# Patient Record
Sex: Male | Born: 1940
Health system: Southern US, Community
[De-identification: ages and names within clinical notes are randomized; demographics above are authoritative.]

## PROBLEM LIST (undated history)

## (undated) DIAGNOSIS — G20 Parkinson's disease: Secondary | ICD-10-CM

## (undated) DIAGNOSIS — C61 Malignant neoplasm of prostate: Secondary | ICD-10-CM

## (undated) DIAGNOSIS — I2109 ST elevation (STEMI) myocardial infarction involving other coronary artery of anterior wall: Secondary | ICD-10-CM

## (undated) DIAGNOSIS — G20A1 Parkinson's disease without dyskinesia, without mention of fluctuations: Secondary | ICD-10-CM

## (undated) DIAGNOSIS — E785 Hyperlipidemia, unspecified: Secondary | ICD-10-CM

## (undated) DIAGNOSIS — I2510 Atherosclerotic heart disease of native coronary artery without angina pectoris: Secondary | ICD-10-CM

## (undated) HISTORY — PX: LAPAROSCOPIC RETROPUBIC PROSTATECTOMY: SUR794

## (undated) HISTORY — DX: Hyperlipidemia, unspecified: E78.5

## (undated) HISTORY — DX: Atherosclerotic heart disease of native coronary artery without angina pectoris: I25.10

## (undated) HISTORY — PX: COLONOSCOPY: SHX174

## (undated) HISTORY — PX: HERNIA REPAIR: SHX51

## (undated) HISTORY — DX: ST elevation (STEMI) myocardial infarction involving other coronary artery of anterior wall: I21.09

## (undated) HISTORY — PX: CARDIAC CATHETERIZATION: SHX172

---

## 2007-04-27 ENCOUNTER — Ambulatory Visit: Payer: Self-pay | Admitting: Cardiology

## 2011-03-26 NOTE — Assessment & Plan Note (Signed)
[facility]                          [facility] OFFICE NOTE   Drew Sanders, Drew Sanders                        MRN:          [id number]  DATE:04/27/2007                            DOB:          [birth year]    REASON FOR CONSULTATION:  Drew Sanders is a 70 year old male, with  reportedly normal remote cardiac catheterization at [facility]  following an abnormal routine treadmill test, now referred for further  evaluation of recent development of chest pain.   The patient's cardiac risk factors are notable for borderline  hypertension, hyperlipidemia, family history of coronary artery disease  and age. The patient has no significant history of tobacco smoking and  denies diabetes mellitus.   The patient remains quite active and golf's several times a week. He  denies any history of exertional anginal pectoris, either remotely or in  the recent past. He also denies any recent decline in exercise  tolerance. Of note, the patient presents in sinus bradycardia and states  that he has always had a slow heart beat, with no associated symptoms.   The patient's current  complaint is recurrent, near daily upper chest  discomfort which appears to be quite focal. It is unpredictable in onset  and is not related to his typical heartburn. There is no radiation to  the jaw or upper extremities, nor any associated diaphoresis or dyspnea.  He also points out that, if anything, these symptoms appear to feel much  better with exercise.   The patient was recently placed on a proton pump inhibitor and an  antihistamine; however, he reports today that this has not had any  significant impact on his chest pains.   Electrocardiogram  today reveals sinus bradycardia at 52 BPM with normal  axis and no ischemic changes.   ALLERGIES:  No known drug allergies.   CURRENT MEDICATIONS:  1. Prilosec OTC 20 mg.  2. Zyrtec OTC 10 mg.  3. Glucosamine.  4. Saw palmetto.   PAST MEDICAL HISTORY:  1. History of hyperlipidemia.  2. Status post hiatal hernia repair.  3. GERD.   SOCIAL HISTORY:  Divorced, with 2 children. Retired Sports coach.  Denies any regular history of tobacco smoking. Drinks alcohol on  occasion.   FAMILY HISTORY:  Father deceased age 12, fatal myocardial infarction  with prior history of several myocardial infarctions.   REVIEW OF SYSTEMS:  Denies diabetes mellitus, has symptoms suggestive of  reflux disease. Otherwise as per HPI, remaining systems negative.   PHYSICAL EXAMINATION:  VITAL SIGNS:  Blood pressure currently 143/94,  pulse 58, regular, weight 218.6.  GENERAL:  A 70 year old male sitting upright in no distress.  HEENT:  Normocephalic, atraumatic.  NECK:  Palpable bilateral carotid pulses without bruits; no JVD.  LUNGS:  Clear to auscultation in all fields.  HEART:  Regular rate with decreased rhythm (S1, S2), no murmurs, rubs or  gallops.  ABDOMEN:  Soft, nontender with intact bowel sounds.  EXTREMITIES:  Palpable distal pulses without edema.  NEUROLOGIC:  No focal deficit.   IMPRESSION:  1. Atypical chest pain.      a.  Reportedly normal remote cardiac catheterization.  2. Asymptomatic sinus bradycardia.  3. History of hyperlipidemia.  4. Family history of coronary artery disease.  5. Gastroesophageal reflux disease.   PLAN:  1. Schedule exercise stress Cardiolite for risk stratification.  2. Check fasting lipid/liver profile.  3. I will start low dose aspirin for primary prevention.  4. Schedule return clinic followup with myself and Dr. Andee Sanders for      review of study results and further recommendations.      Drew Serpe, PA-C  Electronically Signed      Drew Codding, MD,FACC  Electronically Signed   GS/MedQ  DD: 04/27/2007  DT: 04/27/2007  Job #: [id number]   cc:   Drew Sanders

## 2015-01-09 DIAGNOSIS — H40053 Ocular hypertension, bilateral: Secondary | ICD-10-CM | POA: Diagnosis not present

## 2015-02-27 DIAGNOSIS — H52223 Regular astigmatism, bilateral: Secondary | ICD-10-CM | POA: Diagnosis not present

## 2015-02-27 DIAGNOSIS — H25813 Combined forms of age-related cataract, bilateral: Secondary | ICD-10-CM | POA: Diagnosis not present

## 2015-02-27 DIAGNOSIS — H35363 Drusen (degenerative) of macula, bilateral: Secondary | ICD-10-CM | POA: Diagnosis not present

## 2015-02-27 DIAGNOSIS — H35372 Puckering of macula, left eye: Secondary | ICD-10-CM | POA: Diagnosis not present

## 2015-02-27 DIAGNOSIS — H25812 Combined forms of age-related cataract, left eye: Secondary | ICD-10-CM | POA: Diagnosis not present

## 2015-04-03 ENCOUNTER — Encounter (HOSPITAL_COMMUNITY): Payer: Self-pay

## 2015-04-03 ENCOUNTER — Other Ambulatory Visit: Payer: Self-pay

## 2015-04-03 ENCOUNTER — Encounter (HOSPITAL_COMMUNITY)
Admission: RE | Admit: 2015-04-03 | Discharge: 2015-04-03 | Disposition: A | Discharge status: Home / Self Care | Payer: Medicare PPO | Source: Ambulatory Visit | Attending: Ophthalmology | Admitting: Ophthalmology

## 2015-04-03 DIAGNOSIS — H25812 Combined forms of age-related cataract, left eye: Secondary | ICD-10-CM | POA: Diagnosis not present

## 2015-04-03 LAB — BASIC METABOLIC PANEL
ANION GAP: 6 (ref 5–15)
BUN: 23 mg/dL — ABNORMAL HIGH (ref 6–20)
CHLORIDE: 106 mmol/L (ref 101–111)
CO2: 26 mmol/L (ref 22–32)
Calcium: 9 mg/dL (ref 8.9–10.3)
Creatinine, Ser: 1.65 mg/dL — ABNORMAL HIGH (ref 0.61–1.24)
GFR, EST AFRICAN AMERICAN: 46 mL/min — AB (ref >60)
GFR, EST NON AFRICAN AMERICAN: 39 mL/min — AB (ref >60)
Glucose, Bld: 113 mg/dL — ABNORMAL HIGH (ref 65–99)
Potassium: 4.5 mmol/L (ref 3.5–5.1)
Sodium: 138 mmol/L (ref 135–145)

## 2015-04-03 LAB — CBC
HCT: 43.5 % (ref 39.0–52.0)
Hemoglobin: 14.7 g/dL (ref 13.0–17.0)
MCH: 31.1 pg (ref 26.0–34.0)
MCHC: 33.8 g/dL (ref 30.0–36.0)
MCV: 92.2 fL (ref 78.0–100.0)
Platelets: 173 K/uL (ref 150–400)
RBC: 4.72 MIL/uL (ref 4.22–5.81)
RDW: 12.5 % (ref 11.5–15.5)
WBC: 8 K/uL (ref 4.0–10.5)

## 2015-04-03 NOTE — Patient Instructions (Signed)
Your procedure is scheduled on:  [id number]  Report to [facility] at   11:20  AM.  Call this number if you have problems the morning of surgery:[phone]   Remember:   Do not eat or drink :After Midnight.    Take these medicines the morning of surgery with A SIP OF WATER: none   Do not wear jewelry, make-up or nail polish.  Do not wear lotions, powders, or perfumes. You may wear deodorant.  Do not shave 48 hours prior to surgery.  Do not bring valuables to the hospital.  Contacts, dentures or bridgework may not be worn into surgery.  Patients discharged the day of surgery will not be allowed to drive home.  Name and phone number of your driver:    Please read over the following fact sheets that you were given: Pain Booklet, Surgical Site Infection Prevention, Anesthesia Post-op Instructions and Care and Recovery After Surgery  Cataract Surgery  A cataract is a clouding of the lens of the eye. When a lens becomes cloudy, vision is reduced based on the degree and nature of the clouding. Surgery may be needed to improve vision. Surgery removes the cloudy lens and usually replaces it with a substitute lens (intraocular lens, IOL). LET YOUR EYE DOCTOR KNOW ABOUT:  Allergies to food or medicine.   Medicines taken including herbs, eyedrops, over-the-counter medicines, and creams.   Use of steroids (by mouth or creams).   Previous problems with anesthetics or numbing medicine.   History of bleeding problems or blood clots.   Previous surgery.   Other health problems, including diabetes and kidney problems.   Possibility of pregnancy, if this applies.  RISKS AND COMPLICATIONS  Infection.   Inflammation of the eyeball (endophthalmitis) that can spread to both eyes (sympathetic ophthalmia).   Poor wound healing.   If an IOL is inserted, it can later fall out of proper position. This is very uncommon.   Clouding of the part of your eye that holds an IOL in place. This is  called an "after-cataract." These are uncommon, but easily treated.  BEFORE THE PROCEDURE  Do not eat or drink anything except small amounts of water for 8 to 12 before your surgery, or as directed by your caregiver.   Unless you are told otherwise, continue any eyedrops you have been prescribed.   Talk to your primary caregiver about all other medicines that you take (both prescription and non-prescription). In some cases, you may need to stop or change medicines near the time of your surgery. This is most important if you are taking blood-thinning medicine.Do not stop medicines unless you are told to do so.   Arrange for someone to drive you to and from the procedure.   Do not put contact lenses in either eye on the day of your surgery.  PROCEDURE There is more than one method for safely removing a cataract. Your doctor can explain the differences and help determine which is best for you. Phacoemulsification surgery is the most common form of cataract surgery.  An injection is given behind the eye or eyedrops are given to make this a painless procedure.   A small cut (incision) is made on the edge of the clear, dome-shaped surface that covers the front of the eye (cornea).   A tiny probe is painlessly inserted into the eye. This device gives off ultrasound waves that soften and break up the cloudy center of the lens. This makes it easier for the  cloudy lens to be removed by suction.   An IOL may be implanted.   The normal lens of the eye is covered by a clear capsule. Part of that capsule is intentionally left in the eye to support the IOL.   Your surgeon may or may not use stitches to close the incision.  There are other forms of cataract surgery that require a larger incision and stiches to close the eye. This approach is taken in cases where the doctor feels that the cataract cannot be easily removed using phacoemulsification. AFTER THE PROCEDURE  When an IOL is implanted, it  does not need care. It becomes a permanent part of your eye and cannot be seen or felt.   Your doctor will schedule follow-up exams to check on your progress.   Review your other medicines with your doctor to see which can be resumed after surgery.   Use eyedrops or take medicine as prescribed by your doctor.  Document Released: 10/17/2011 Document Reviewed: 10/14/2011 [facility] Patient Information 2012 [place].  .Cataract Surgery Care After Refer to this sheet in the next few weeks. These instructions provide you with information on caring for yourself after your procedure. Your caregiver may also give you more specific instructions. Your treatment has been planned according to current medical practices, but problems sometimes occur. Call your caregiver if you have any problems or questions after your procedure.  HOME CARE INSTRUCTIONS   Avoid strenuous activities as directed by your caregiver.   Ask your caregiver when you can resume driving.   Use eyedrops or other medicines to help healing and control pressure inside your eye as directed by your caregiver.   Only take over-the-counter or prescription medicines for pain, discomfort, or fever as directed by your caregiver.   Do not to touch or rub your eyes.   You may be instructed to use a protective shield during the first few days and nights after surgery. If not, wear sunglasses to protect your eyes. This is to protect the eye from pressure or from being accidentally bumped.   Keep the area around your eye clean and dry. Avoid swimming or allowing water to hit you directly in the face while showering. Keep soap and shampoo out of your eyes.   Do not bend or lift heavy objects. Bending increases pressure in the eye. You can walk, climb stairs, and do light household chores.   Do not put a contact lens into the eye that had surgery until your caregiver says it is okay to do so.   Ask your doctor when you can return to  work. This will depend on the kind of work that you do. If you work in a dusty environment, you may be advised to wear protective eyewear for a period of time.   Ask your caregiver when it will be safe to engage in sexual activity.   Continue with your regular eye exams as directed by your caregiver.  What to expect:  It is normal to feel itching and mild discomfort for a few days after cataract surgery. Some fluid discharge is also common, and your eye may be sensitive to light and touch.   After 1 to 2 days, even moderate discomfort should disappear. In most cases, healing will take about 6 weeks.   If you received an intraocular lens (IOL), you may notice that colors are very bright or have a blue tinge. Also, if you have been in bright sunlight, everything may appear reddish  for a few hours. If you see these color tinges, it is because your lens is clear and no longer cloudy. Within a few months after receiving an IOL, these extra colors should go away. When you have healed, you will probably need new glasses.  SEEK MEDICAL CARE IF:   You have increased bruising around your eye.   You have discomfort not helped by medicine.  SEEK IMMEDIATE MEDICAL CARE IF:   You have a fever.   You have a worsening or sudden vision loss.   You have redness, swelling, or increasing pain in the eye.   You have a thick discharge from the eye that had surgery.  MAKE SURE YOU:  Understand these instructions.   Will watch your condition.   Will get help right away if you are not doing well or get worse.  Document Released: 05/17/2005 Document Revised: 10/17/2011 Document Reviewed: 06/21/2011 [facility] Patient Information 2012 [place].    Monitored Anesthesia Care  Monitored anesthesia care is an anesthesia service for a medical procedure. Anesthesia is the loss of the ability to feel pain. It is produced by medications called anesthetics. It may affect a small area of your body (local  anesthesia), a large area of your body (regional anesthesia), or your entire body (general anesthesia). The need for monitored anesthesia care depends your procedure, your condition, and the potential need for regional or general anesthesia. It is often provided during procedures where:   General anesthesia may be needed if there are complications. This is because you need special care when you are under general anesthesia.   You will be under local or regional anesthesia. This is so that you are able to have higher levels of anesthesia if needed.   You will receive calming medications (sedatives). This is especially the case if sedatives are given to put you in a semi-conscious state of relaxation (deep sedation). This is because the amount of sedative needed to produce this state can be hard to predict. Too much of a sedative can produce general anesthesia. Monitored anesthesia care is performed by one or more caregivers who have special training in all types of anesthesia. You will need to meet with these caregivers before your procedure. During this meeting, they will ask you about your medical history. They will also give you instructions to follow. (For example, you will need to stop eating and drinking before your procedure. You may also need to stop or change medications you are taking.) During your procedure, your caregivers will stay with you. They will:   Watch your condition. This includes watching you blood pressure, breathing, and level of pain.   Diagnose and treat problems that occur.   Give medications if they are needed. These may include calming medications (sedatives) and anesthetics.   Make sure you are comfortable.  Having monitored anesthesia care does not necessarily mean that you will be under anesthesia. It does mean that your caregivers will be able to manage anesthesia if you need it or if it occurs. It also means that you will be able to have a different type of  anesthesia than you are having if you need it. When your procedure is complete, your caregivers will continue to watch your condition. They will make sure any medications wear off before you are allowed to go home.  Document Released: 07/24/2005 Document Revised: 02/22/2013 Document Reviewed: 12/09/2012 [facility] Patient Information 2014 [place], [place].

## 2015-04-05 MED ORDER — PHENYLEPHRINE HCL 2.5 % OP SOLN
OPHTHALMIC | Status: AC
  Filled 2015-04-05: qty 15

## 2015-04-05 MED ORDER — NEOMYCIN-POLYMYXIN-DEXAMETH 3.5-10000-0.1 OP SUSP
OPHTHALMIC | Status: AC
  Filled 2015-04-05: qty 5

## 2015-04-05 MED ORDER — TETRACAINE HCL 0.5 % OP SOLN
OPHTHALMIC | Status: AC
  Filled 2015-04-05: qty 2

## 2015-04-05 MED ORDER — LIDOCAINE HCL 3.5 % OP GEL
OPHTHALMIC | Status: AC
  Filled 2015-04-05: qty 1

## 2015-04-05 MED ORDER — LIDOCAINE HCL (PF) 1 % IJ SOLN
INTRAMUSCULAR | Status: AC
  Filled 2015-04-05: qty 2

## 2015-04-05 MED ORDER — CYCLOPENTOLATE-PHENYLEPHRINE OP SOLN OPTIME - NO CHARGE
OPHTHALMIC | Status: AC
  Filled 2015-04-05: qty 2

## 2015-04-06 ENCOUNTER — Ambulatory Visit (HOSPITAL_COMMUNITY): Payer: Medicare PPO | Admitting: Anesthesiology

## 2015-04-06 ENCOUNTER — Encounter (HOSPITAL_COMMUNITY)
Admission: RE | Disposition: A | Discharge status: Home / Self Care | Payer: Self-pay | Source: Ambulatory Visit | Attending: Ophthalmology

## 2015-04-06 ENCOUNTER — Ambulatory Visit (HOSPITAL_COMMUNITY)
Admission: RE | Admit: 2015-04-06 | Discharge: 2015-04-06 | Disposition: A | Discharge status: Home / Self Care | Payer: Medicare PPO | Source: Ambulatory Visit | Attending: Ophthalmology | Admitting: Ophthalmology

## 2015-04-06 ENCOUNTER — Encounter (HOSPITAL_COMMUNITY): Payer: Self-pay | Admitting: *Deleted

## 2015-04-06 DIAGNOSIS — H2512 Age-related nuclear cataract, left eye: Secondary | ICD-10-CM | POA: Diagnosis not present

## 2015-04-06 DIAGNOSIS — H259 Unspecified age-related cataract: Secondary | ICD-10-CM | POA: Diagnosis not present

## 2015-04-06 DIAGNOSIS — R69 Illness, unspecified: Secondary | ICD-10-CM | POA: Diagnosis not present

## 2015-04-06 DIAGNOSIS — H25812 Combined forms of age-related cataract, left eye: Secondary | ICD-10-CM | POA: Insufficient documentation

## 2015-04-06 HISTORY — PX: CATARACT EXTRACTION W/PHACO: SHX586

## 2015-04-06 SURGERY — PHACOEMULSIFICATION, CATARACT, WITH IOL INSERTION
Anesthesia: Monitor Anesthesia Care | Site: Eye | Laterality: Left

## 2015-04-06 MED ORDER — EPINEPHRINE HCL 1 MG/ML IJ SOLN
INTRAOCULAR | Status: DC | PRN
  Administered 2015-04-06: 500 mL

## 2015-04-06 MED ORDER — MIDAZOLAM HCL 2 MG/2ML IJ SOLN
INTRAMUSCULAR | Status: AC
  Filled 2015-04-06: qty 2

## 2015-04-06 MED ORDER — PROVISC 10 MG/ML IO SOLN
INTRAOCULAR | Status: DC | PRN
  Administered 2015-04-06: 0.85 mL via INTRAOCULAR

## 2015-04-06 MED ORDER — MIDAZOLAM HCL 2 MG/2ML IJ SOLN
1.0000 mg | INTRAMUSCULAR | Status: DC | PRN
  Administered 2015-04-06 (×2): 1 mg via INTRAVENOUS
  Administered 2015-04-06: 2 mg via INTRAVENOUS

## 2015-04-06 MED ORDER — LACTATED RINGERS IV SOLN
INTRAVENOUS | Status: DC
  Administered 2015-04-06: 13:00:00 via INTRAVENOUS

## 2015-04-06 MED ORDER — FENTANYL CITRATE (PF) 100 MCG/2ML IJ SOLN
INTRAMUSCULAR | Status: AC
  Filled 2015-04-06: qty 2

## 2015-04-06 MED ORDER — FENTANYL CITRATE (PF) 100 MCG/2ML IJ SOLN
25.0000 ug | INTRAMUSCULAR | Status: AC
  Administered 2015-04-06 (×2): 25 ug via INTRAVENOUS

## 2015-04-06 MED ORDER — LIDOCAINE HCL (PF) 1 % IJ SOLN
INTRAOCULAR | Status: DC | PRN
  Administered 2015-04-06: .9 mL via OPHTHALMIC

## 2015-04-06 MED ORDER — EPINEPHRINE HCL 1 MG/ML IJ SOLN
INTRAMUSCULAR | Status: AC
  Filled 2015-04-06: qty 1

## 2015-04-06 MED ORDER — LIDOCAINE 3.5 % OP GEL OPTIME - NO CHARGE
OPHTHALMIC | Status: DC | PRN
  Administered 2015-04-06: 1 [drp] via OPHTHALMIC

## 2015-04-06 MED ORDER — NEOMYCIN-POLYMYXIN-DEXAMETH 3.5-10000-0.1 OP SUSP
OPHTHALMIC | Status: DC | PRN
  Administered 2015-04-06: 1 [drp] via OPHTHALMIC

## 2015-04-06 MED ORDER — CYCLOPENTOLATE-PHENYLEPHRINE 0.2-1 % OP SOLN
1.0000 [drp] | OPHTHALMIC | Status: AC
  Administered 2015-04-06 (×3): 1 [drp] via OPHTHALMIC

## 2015-04-06 MED ORDER — BSS IO SOLN
INTRAOCULAR | Status: DC | PRN
  Administered 2015-04-06: 15 mL via INTRAOCULAR

## 2015-04-06 MED ORDER — PHENYLEPHRINE HCL 2.5 % OP SOLN
1.0000 [drp] | OPHTHALMIC | Status: AC
  Administered 2015-04-06 (×3): 1 [drp] via OPHTHALMIC

## 2015-04-06 MED ORDER — POVIDONE-IODINE 5 % OP SOLN
OPHTHALMIC | Status: DC | PRN
  Administered 2015-04-06: 1 via OPHTHALMIC

## 2015-04-06 MED ORDER — TETRACAINE HCL 0.5 % OP SOLN
1.0000 [drp] | OPHTHALMIC | Status: AC
  Administered 2015-04-06 (×3): 1 [drp] via OPHTHALMIC

## 2015-04-06 MED ORDER — LIDOCAINE HCL 3.5 % OP GEL: 1.0000 "application " | Freq: Once | OPHTHALMIC | Status: DC

## 2015-04-06 SURGICAL SUPPLY — 34 items
CAPSULAR TENSION RING-AMO (OPHTHALMIC RELATED) IMPLANT
CLOTH BEACON ORANGE TIMEOUT ST (SAFETY) ×2 IMPLANT
EYE SHIELD UNIVERSAL CLEAR (GAUZE/BANDAGES/DRESSINGS) ×2 IMPLANT
GLOVE BIO SURGEON STRL SZ 6.5 (GLOVE) IMPLANT
GLOVE BIO SURGEONS STRL SZ 6.5 (GLOVE)
GLOVE BIOGEL PI IND STRL 6.5 (GLOVE) IMPLANT
GLOVE BIOGEL PI IND STRL 7.0 (GLOVE) IMPLANT
GLOVE BIOGEL PI IND STRL 7.5 (GLOVE) IMPLANT
GLOVE BIOGEL PI INDICATOR 6.5 (GLOVE) ×2
GLOVE BIOGEL PI INDICATOR 7.0 (GLOVE)
GLOVE BIOGEL PI INDICATOR 7.5 (GLOVE)
GLOVE ECLIPSE 6.5 STRL STRAW (GLOVE) IMPLANT
GLOVE ECLIPSE 7.0 STRL STRAW (GLOVE) IMPLANT
GLOVE ECLIPSE 7.5 STRL STRAW (GLOVE) IMPLANT
GLOVE EXAM NITRILE LRG STRL (GLOVE) IMPLANT
GLOVE EXAM NITRILE MD LF STRL (GLOVE) ×2 IMPLANT
GLOVE SKINSENSE NS SZ6.5 (GLOVE)
GLOVE SKINSENSE NS SZ7.0 (GLOVE)
GLOVE SKINSENSE STRL SZ6.5 (GLOVE) IMPLANT
GLOVE SKINSENSE STRL SZ7.0 (GLOVE) IMPLANT
KIT VITRECTOMY (OPHTHALMIC RELATED) IMPLANT
PAD ARMBOARD 7.5X6 YLW CONV (MISCELLANEOUS) ×2 IMPLANT
PROC W NO LENS (INTRAOCULAR LENS)
PROC W SPEC LENS (INTRAOCULAR LENS)
PROCESS W NO LENS (INTRAOCULAR LENS) IMPLANT
PROCESS W SPEC LENS (INTRAOCULAR LENS) IMPLANT
RETRACTOR IRIS SIGHTPATH (OPHTHALMIC RELATED) IMPLANT
RING MALYGIN (MISCELLANEOUS) IMPLANT
SIGHTPATH CAT PROC W REG LENS (Ophthalmic Related) ×3 IMPLANT
SYRINGE LUER LOK 1CC (MISCELLANEOUS) ×2 IMPLANT
TAPE SURG TRANSPORE 1 IN (GAUZE/BANDAGES/DRESSINGS) IMPLANT
TAPE SURGICAL TRANSPORE 1 IN (GAUZE/BANDAGES/DRESSINGS) ×2
VISCOELASTIC ADDITIONAL (OPHTHALMIC RELATED) IMPLANT
WATER STERILE IRR 250ML POUR (IV SOLUTION) ×2 IMPLANT

## 2015-04-06 NOTE — Discharge Instructions (Signed)

## 2015-04-06 NOTE — Op Note (Signed)
Date of Admission: 04/06/2015  Date of Surgery: 04/06/2015   Pre-Op Dx: Cataract Left Eye  Post-Op Dx: Senile Combined Cataract Left  Eye,  Dx Code W09.811  Surgeon: Tonny Branch, M.D.  Assistants: None  Anesthesia: Topical with MAC  Indications: Painless, progressive loss of vision with compromise of daily activities.  Surgery: Cataract Extraction with Intraocular lens Implant Left Eye  Discription: The patient had dilating drops and viscous lidocaine placed into the Left eye in the pre-op holding area. After transfer to the operating room, a time out was performed. The patient was then prepped and draped. Beginning with a 44 degree blade a paracentesis port was made at the surgeon's 2 o'clock position. The anterior chamber was then filled with 1% non-preserved lidocaine. This was followed by filling the anterior chamber with Provisc.  A 2.29mm keratome blade was used to make a clear corneal incision at the temporal limbus.  A bent cystatome needle was used to create a continuous tear capsulotomy. Hydrodissection was performed with balanced salt solution on a Fine canula. The lens nucleus was then removed using the phacoemulsification handpiece. Residual cortex was removed with the I&A handpiece. The anterior chamber and capsular bag were refilled with Provisc. A posterior chamber intraocular lens was placed into the capsular bag with it's injector. The implant was positioned with the Kuglan hook. The Provisc was then removed from the anterior chamber and capsular bag with the I&A handpiece. Stromal hydration of the main incision and paracentesis port was performed with BSS on a Fine canula. The wounds were tested for leak which was negative. The patient tolerated the procedure well. There were no operative complications. The patient was then transferred to the recovery room in stable condition.  Complications: None  Specimen: None  EBL: None  Prosthetic device: Hoya iSert 250, power 13.0 D, SN  [id number].

## 2015-04-06 NOTE — Transfer of Care (Signed)
Immediate Anesthesia Transfer of Care Note  Patient: Drew Sanders  Procedure(s) Performed: Procedure(s): CATARACT EXTRACTION PHACO AND INTRAOCULAR LENS PLACEMENT :  CDE:  9.79 (Left)  Patient Location: Short Stay  Anesthesia Type:MAC  Level of Consciousness: awake, alert , oriented and patient cooperative  Airway & Oxygen Therapy: Patient Spontanous Breathing  Post-op Assessment: Report given to RN, Post -op Vital signs reviewed and stable and Patient moving all extremities X 4  Post vital signs: Reviewed and stable  Last Vitals:  Filed Vitals:   04/06/15 1225  BP: 177/86  Pulse: 57  Temp: 36.3 C  Resp: 14    Complications: No apparent anesthesia complications

## 2015-04-06 NOTE — Anesthesia Procedure Notes (Signed)
Procedure Name: MAC Date/Time: 04/06/2015 1:25 PM Performed by: Michele Rockers Pre-anesthesia Checklist: Patient identified, Emergency Drugs available, Suction available, Timeout performed and Patient being monitored Patient Re-evaluated:Patient Re-evaluated prior to inductionOxygen Delivery Method: Nasal Cannula

## 2015-04-06 NOTE — Anesthesia Postprocedure Evaluation (Signed)
  Anesthesia Post-op Note  Patient: Drew Sanders  Procedure(s) Performed: Procedure(s): CATARACT EXTRACTION PHACO AND INTRAOCULAR LENS PLACEMENT :  CDE:  9.79 (Left)  Patient Location: PACU and Short Stay  Anesthesia Type:MAC  Level of Consciousness: awake, alert , oriented and patient cooperative  Airway and Oxygen Therapy: Patient Spontanous Breathing  Post-op Pain: none  Post-op Assessment: Post-op Vital signs reviewed, Patient's Cardiovascular Status Stable, Respiratory Function Stable, Patent Airway, No signs of Nausea or vomiting, Adequate PO intake and Pain level controlled  Post-op Vital Signs: Reviewed and stable  Last Vitals:  Filed Vitals:   04/06/15 1225  BP: 177/86  Pulse: 57  Temp: 36.3 C  Resp: 14    Complications: No apparent anesthesia complications

## 2015-04-06 NOTE — H&P (Signed)
I have reviewed the H&P, the patient was re-examined, and I have identified no interval changes in medical condition and plan of care since the history and physical of record  

## 2015-04-06 NOTE — Anesthesia Preprocedure Evaluation (Signed)
Anesthesia Evaluation  Patient identified by MRN, date of birth, ID band Patient awake    Reviewed: Allergy & Precautions, NPO status , Patient's Chart, lab work & pertinent test results  Airway Mallampati: II  TM Distance: >3 FB     Dental  (+) Teeth Intact, Implants   Pulmonary neg pulmonary ROS,  breath sounds clear to auscultation        Cardiovascular negative cardio ROS  Rhythm:Regular Rate:Normal     Neuro/Psych    GI/Hepatic   Endo/Other    Renal/GU      Musculoskeletal   Abdominal   Peds  Hematology   Anesthesia Other Findings S/p prostate CA  Reproductive/Obstetrics                             Anesthesia Physical Anesthesia Plan  ASA: II  Anesthesia Plan: MAC   Post-op Pain Management:    Induction: Intravenous  Airway Management Planned: Nasal Cannula  Additional Equipment:   Intra-op Plan:   Post-operative Plan:   Informed Consent: I have reviewed the patients History and Physical, chart, labs and discussed the procedure including the risks, benefits and alternatives for the proposed anesthesia with the patient or authorized representative who has indicated his/her understanding and acceptance.     Plan Discussed with:   Anesthesia Plan Comments:         Anesthesia Quick Evaluation

## 2015-04-07 ENCOUNTER — Encounter (HOSPITAL_COMMUNITY): Payer: Self-pay | Admitting: Ophthalmology

## 2015-04-07 NOTE — Addendum Note (Signed)
Addendum  created 04/07/15 1220 by Mickel Baas, CRNA   Modules edited: Charges VN

## 2015-04-18 ENCOUNTER — Encounter (HOSPITAL_COMMUNITY): Payer: Self-pay

## 2015-04-18 ENCOUNTER — Encounter (HOSPITAL_COMMUNITY)
Admission: RE | Admit: 2015-04-18 | Discharge: 2015-04-18 | Disposition: A | Discharge status: Home / Self Care | Payer: Medicare PPO | Source: Ambulatory Visit | Attending: Ophthalmology | Admitting: Ophthalmology

## 2015-04-18 HISTORY — DX: Malignant neoplasm of prostate: C61

## 2015-04-21 MED ORDER — LIDOCAINE HCL 3.5 % OP GEL
OPHTHALMIC | Status: AC
  Filled 2015-04-21: qty 1

## 2015-04-21 MED ORDER — TETRACAINE HCL 0.5 % OP SOLN
OPHTHALMIC | Status: AC
  Filled 2015-04-21: qty 2

## 2015-04-21 MED ORDER — PHENYLEPHRINE HCL 2.5 % OP SOLN
OPHTHALMIC | Status: AC
  Filled 2015-04-21: qty 15

## 2015-04-21 MED ORDER — NEOMYCIN-POLYMYXIN-DEXAMETH 3.5-10000-0.1 OP SUSP
OPHTHALMIC | Status: AC
  Filled 2015-04-21: qty 5

## 2015-04-21 MED ORDER — LIDOCAINE HCL (PF) 1 % IJ SOLN
INTRAMUSCULAR | Status: AC
  Filled 2015-04-21: qty 2

## 2015-04-21 MED ORDER — CYCLOPENTOLATE-PHENYLEPHRINE OP SOLN OPTIME - NO CHARGE
OPHTHALMIC | Status: AC
  Filled 2015-04-21: qty 2

## 2015-04-24 ENCOUNTER — Ambulatory Visit (HOSPITAL_COMMUNITY): Payer: Medicare PPO | Admitting: Anesthesiology

## 2015-04-24 ENCOUNTER — Encounter (HOSPITAL_COMMUNITY)
Admission: RE | Disposition: A | Discharge status: Home / Self Care | Payer: Self-pay | Source: Ambulatory Visit | Attending: Ophthalmology

## 2015-04-24 ENCOUNTER — Encounter (HOSPITAL_COMMUNITY): Payer: Self-pay | Admitting: *Deleted

## 2015-04-24 ENCOUNTER — Ambulatory Visit (HOSPITAL_COMMUNITY)
Admission: RE | Admit: 2015-04-24 | Discharge: 2015-04-24 | Disposition: A | Discharge status: Home / Self Care | Payer: Medicare PPO | Source: Ambulatory Visit | Attending: Ophthalmology | Admitting: Ophthalmology

## 2015-04-24 DIAGNOSIS — H25811 Combined forms of age-related cataract, right eye: Secondary | ICD-10-CM | POA: Diagnosis not present

## 2015-04-24 DIAGNOSIS — Z8546 Personal history of malignant neoplasm of prostate: Secondary | ICD-10-CM | POA: Diagnosis not present

## 2015-04-24 DIAGNOSIS — H269 Unspecified cataract: Secondary | ICD-10-CM | POA: Diagnosis present

## 2015-04-24 HISTORY — PX: CATARACT EXTRACTION W/PHACO: SHX586

## 2015-04-24 SURGERY — PHACOEMULSIFICATION, CATARACT, WITH IOL INSERTION
Anesthesia: Monitor Anesthesia Care | Site: Eye | Laterality: Right

## 2015-04-24 MED ORDER — BSS IO SOLN
INTRAOCULAR | Status: DC | PRN
  Administered 2015-04-24: 500 mL

## 2015-04-24 MED ORDER — EPINEPHRINE HCL 1 MG/ML IJ SOLN
INTRAMUSCULAR | Status: AC
  Filled 2015-04-24: qty 1

## 2015-04-24 MED ORDER — CYCLOPENTOLATE-PHENYLEPHRINE 0.2-1 % OP SOLN
1.0000 [drp] | OPHTHALMIC | Status: AC
  Administered 2015-04-24 (×3): 1 [drp] via OPHTHALMIC

## 2015-04-24 MED ORDER — NEOMYCIN-POLYMYXIN-DEXAMETH 3.5-10000-0.1 OP SUSP
OPHTHALMIC | Status: DC | PRN
  Administered 2015-04-24: 2 [drp] via OPHTHALMIC

## 2015-04-24 MED ORDER — FENTANYL CITRATE (PF) 100 MCG/2ML IJ SOLN
25.0000 ug | INTRAMUSCULAR | Status: AC
  Administered 2015-04-24 (×2): 25 ug via INTRAVENOUS

## 2015-04-24 MED ORDER — MIDAZOLAM HCL 2 MG/2ML IJ SOLN
INTRAMUSCULAR | Status: AC
  Filled 2015-04-24: qty 2

## 2015-04-24 MED ORDER — LIDOCAINE HCL (PF) 1 % IJ SOLN
INTRAOCULAR | Status: DC | PRN
  Administered 2015-04-24: .7 mL via OPHTHALMIC

## 2015-04-24 MED ORDER — MIDAZOLAM HCL 2 MG/2ML IJ SOLN
1.0000 mg | INTRAMUSCULAR | Status: DC | PRN
Start: 2015-04-24 — End: 2015-04-24
  Administered 2015-04-24: 2 mg via INTRAVENOUS

## 2015-04-24 MED ORDER — BSS IO SOLN
INTRAOCULAR | Status: AC
  Filled 2015-04-24: qty 4

## 2015-04-24 MED ORDER — FENTANYL CITRATE (PF) 100 MCG/2ML IJ SOLN
INTRAMUSCULAR | Status: AC
  Filled 2015-04-24: qty 2

## 2015-04-24 MED ORDER — LIDOCAINE HCL 3.5 % OP GEL
1.0000 "application " | Freq: Once | OPHTHALMIC | Status: AC
  Administered 2015-04-24: 1 via OPHTHALMIC

## 2015-04-24 MED ORDER — LACTATED RINGERS IV SOLN
INTRAVENOUS | Status: DC
  Administered 2015-04-24: 1000 mL via INTRAVENOUS

## 2015-04-24 MED ORDER — BSS IO SOLN
INTRAOCULAR | Status: DC | PRN
  Administered 2015-04-24: 15 mL

## 2015-04-24 MED ORDER — PROVISC 10 MG/ML IO SOLN
INTRAOCULAR | Status: DC | PRN
  Administered 2015-04-24: 0.85 mL via INTRAOCULAR

## 2015-04-24 MED ORDER — POVIDONE-IODINE 5 % OP SOLN
OPHTHALMIC | Status: DC | PRN
  Administered 2015-04-24: 1 via OPHTHALMIC

## 2015-04-24 MED ORDER — TETRACAINE HCL 0.5 % OP SOLN
1.0000 [drp] | OPHTHALMIC | Status: AC
  Administered 2015-04-24 (×3): 1 [drp] via OPHTHALMIC

## 2015-04-24 MED ORDER — PHENYLEPHRINE HCL 2.5 % OP SOLN
1.0000 [drp] | OPHTHALMIC | Status: AC
  Administered 2015-04-24 (×3): 1 [drp] via OPHTHALMIC

## 2015-04-24 SURGICAL SUPPLY — 10 items
CLOTH BEACON ORANGE TIMEOUT ST (SAFETY) ×2 IMPLANT
EYE SHIELD UNIVERSAL CLEAR (GAUZE/BANDAGES/DRESSINGS) ×2 IMPLANT
GLOVE BIOGEL PI IND STRL 7.0 (GLOVE) IMPLANT
GLOVE BIOGEL PI INDICATOR 7.0 (GLOVE) ×4
PAD ARMBOARD 7.5X6 YLW CONV (MISCELLANEOUS) ×2 IMPLANT
SIGHTPATH CAT PROC W REG LENS (Ophthalmic Related) ×3 IMPLANT
SYRINGE LUER LOK 1CC (MISCELLANEOUS) ×2 IMPLANT
TAPE SURG TRANSPORE 1 IN (GAUZE/BANDAGES/DRESSINGS) IMPLANT
TAPE SURGICAL TRANSPORE 1 IN (GAUZE/BANDAGES/DRESSINGS) ×2
WATER STERILE IRR 250ML POUR (IV SOLUTION) ×2 IMPLANT

## 2015-04-24 NOTE — Transfer of Care (Signed)
Immediate Anesthesia Transfer of Care Note  Patient: Drew Sanders  Procedure(s) Performed: Procedure(s) with comments: CATARACT EXTRACTION PHACO AND INTRAOCULAR LENS PLACEMENT (IOC) (Right) - CDE: 12.67  Patient Location: Short Stay  Anesthesia Type:MAC  Level of Consciousness: awake, alert  and oriented  Airway & Oxygen Therapy: Patient Spontanous Breathing  Post-op Assessment: Report given to RN  Post vital signs: Reviewed  Last Vitals:  Filed Vitals:   04/24/15 1350  BP: 115/74  Pulse:   Temp:   Resp: 11    Complications: No apparent anesthesia complications

## 2015-04-24 NOTE — Anesthesia Postprocedure Evaluation (Signed)
  Anesthesia Post-op Note  Patient: Drew Sanders  Procedure(s) Performed: Procedure(s) with comments: CATARACT EXTRACTION PHACO AND INTRAOCULAR LENS PLACEMENT (IOC) (Right) - CDE: 12.67  Patient Location: Short Stay  Anesthesia Type:MAC  Level of Consciousness: awake, alert  and oriented  Airway and Oxygen Therapy: Patient Spontanous Breathing  Post-op Pain: none  Post-op Assessment: Post-op Vital signs reviewed, Patient's Cardiovascular Status Stable, Respiratory Function Stable, Patent Airway and No signs of Nausea or vomiting              Post-op Vital Signs: Reviewed and stable  Last Vitals:  Filed Vitals:   04/24/15 1350  BP: 115/74  Pulse:   Temp:   Resp: 11    Complications: No apparent anesthesia complications

## 2015-04-24 NOTE — H&P (Signed)
I have reviewed the H&P, the patient was re-examined, and I have identified no interval changes in medical condition and plan of care since the history and physical of record  

## 2015-04-24 NOTE — Op Note (Addendum)
Date of Admission: 04/24/2015  Date of Surgery: 04/24/2015   Pre-Op Dx: Cataract Right Eye  Post-Op Dx: Senile Combined Cataract Right  Eye,  Dx Code Y33.383  Surgeon: Tonny Branch, M.D.  Assistants: None  Anesthesia: Topical with MAC  Indications: Painless, progressive loss of vision with compromise of daily activities.  Surgery: Cataract Extraction with Intraocular lens Implant Right Eye  Discription: The patient had dilating drops and viscous lidocaine placed into the Right eye in the pre-op holding area. After transfer to the operating room, a time out was performed. The patient was then prepped and draped. Omidria was mixed into the infusion bottle. Beginning with a 70 degree blade a paracentesis port was made at the surgeon's 2 o'clock position. The anterior chamber was then filled with 1% non-preserved lidocaine with epi. This was followed by filling the anterior chamber with Provisc.  A 2.16mm keratome blade was used to make a clear corneal incision at the temporal limbus.  A bent cystatome needle was used to create a continuous tear capsulotomy. Hydrodissection was performed with balanced salt solution on a Fine canula. The lens nucleus was then removed using the phacoemulsification handpiece. Residual cortex was removed with the I&A handpiece. The anterior chamber and capsular bag were refilled with Provisc. A posterior chamber intraocular lens was placed into the capsular bag with it's injector. The implant was positioned with the Kuglan hook. The Provisc was then removed from the anterior chamber and capsular bag with the I&A handpiece. Stromal hydration of the main incision and paracentesis port was performed with BSS on a Fine canula. The wounds were tested for leak which was negative. The patient tolerated the procedure well. There were no operative complications. The patient was then transferred to the recovery room in stable condition.  Complications: None  Specimen: None  EBL:  None  Prosthetic device: Hoya iSert 250, power 13.0 D, SN [id number].

## 2015-04-24 NOTE — Anesthesia Preprocedure Evaluation (Signed)
Anesthesia Evaluation  Patient identified by MRN, date of birth, ID band Patient awake    Reviewed: Allergy & Precautions, NPO status , Patient's Chart, lab work & pertinent test results  Airway Mallampati: II  TM Distance: >3 FB     Dental  (+) Teeth Intact, Implants   Pulmonary neg pulmonary ROS,  breath sounds clear to auscultation        Cardiovascular negative cardio ROS  Rhythm:Regular Rate:Normal     Neuro/Psych    GI/Hepatic   Endo/Other    Renal/GU      Musculoskeletal   Abdominal   Peds  Hematology   Anesthesia Other Findings S/p prostate CA  Reproductive/Obstetrics                             Anesthesia Physical Anesthesia Plan  ASA: II  Anesthesia Plan: MAC   Post-op Pain Management:    Induction: Intravenous  Airway Management Planned: Nasal Cannula  Additional Equipment:   Intra-op Plan:   Post-operative Plan:   Informed Consent: I have reviewed the patients History and Physical, chart, labs and discussed the procedure including the risks, benefits and alternatives for the proposed anesthesia with the patient or authorized representative who has indicated his/her understanding and acceptance.     Plan Discussed with:   Anesthesia Plan Comments:         Anesthesia Quick Evaluation

## 2015-04-24 NOTE — Discharge Instructions (Signed)

## 2015-04-25 ENCOUNTER — Encounter (HOSPITAL_COMMUNITY): Payer: Self-pay | Admitting: Ophthalmology

## 2015-11-17 DIAGNOSIS — G250 Essential tremor: Secondary | ICD-10-CM | POA: Diagnosis not present

## 2015-11-17 DIAGNOSIS — Z6827 Body mass index (BMI) 27.0-27.9, adult: Secondary | ICD-10-CM | POA: Diagnosis not present

## 2015-11-17 DIAGNOSIS — E784 Other hyperlipidemia: Secondary | ICD-10-CM | POA: Diagnosis not present

## 2016-02-19 DIAGNOSIS — E784 Other hyperlipidemia: Secondary | ICD-10-CM | POA: Diagnosis not present

## 2016-02-19 DIAGNOSIS — Z6827 Body mass index (BMI) 27.0-27.9, adult: Secondary | ICD-10-CM | POA: Diagnosis not present

## 2016-02-19 DIAGNOSIS — I10 Essential (primary) hypertension: Secondary | ICD-10-CM | POA: Diagnosis not present

## 2016-02-19 DIAGNOSIS — M545 Low back pain: Secondary | ICD-10-CM | POA: Diagnosis not present

## 2016-06-18 DIAGNOSIS — H16221 Keratoconjunctivitis sicca, not specified as Sjogren's, right eye: Secondary | ICD-10-CM | POA: Diagnosis not present

## 2016-06-18 DIAGNOSIS — G250 Essential tremor: Secondary | ICD-10-CM | POA: Diagnosis not present

## 2016-06-18 DIAGNOSIS — Z6826 Body mass index (BMI) 26.0-26.9, adult: Secondary | ICD-10-CM | POA: Diagnosis not present

## 2016-06-18 DIAGNOSIS — M545 Low back pain: Secondary | ICD-10-CM | POA: Diagnosis not present

## 2016-06-18 DIAGNOSIS — E784 Other hyperlipidemia: Secondary | ICD-10-CM | POA: Diagnosis not present

## 2016-06-20 DIAGNOSIS — M47816 Spondylosis without myelopathy or radiculopathy, lumbar region: Secondary | ICD-10-CM | POA: Diagnosis not present

## 2016-06-20 DIAGNOSIS — M545 Low back pain: Secondary | ICD-10-CM | POA: Diagnosis not present

## 2016-08-19 DIAGNOSIS — M545 Low back pain: Secondary | ICD-10-CM | POA: Diagnosis not present

## 2016-08-19 DIAGNOSIS — Z6826 Body mass index (BMI) 26.0-26.9, adult: Secondary | ICD-10-CM | POA: Diagnosis not present

## 2016-08-19 DIAGNOSIS — G20 Parkinson's disease: Secondary | ICD-10-CM | POA: Diagnosis not present

## 2016-08-19 DIAGNOSIS — E784 Other hyperlipidemia: Secondary | ICD-10-CM | POA: Diagnosis not present

## 2016-11-25 DIAGNOSIS — Z6826 Body mass index (BMI) 26.0-26.9, adult: Secondary | ICD-10-CM | POA: Diagnosis not present

## 2016-11-25 DIAGNOSIS — G250 Essential tremor: Secondary | ICD-10-CM | POA: Diagnosis not present

## 2016-11-25 DIAGNOSIS — Z125 Encounter for screening for malignant neoplasm of prostate: Secondary | ICD-10-CM | POA: Diagnosis not present

## 2016-11-25 DIAGNOSIS — Z0000 Encounter for general adult medical examination without abnormal findings: Secondary | ICD-10-CM | POA: Diagnosis not present

## 2016-11-25 DIAGNOSIS — E784 Other hyperlipidemia: Secondary | ICD-10-CM | POA: Diagnosis not present

## 2016-11-25 DIAGNOSIS — Z79899 Other long term (current) drug therapy: Secondary | ICD-10-CM | POA: Diagnosis not present

## 2016-11-25 DIAGNOSIS — Z1389 Encounter for screening for other disorder: Secondary | ICD-10-CM | POA: Diagnosis not present

## 2016-11-25 DIAGNOSIS — M545 Low back pain: Secondary | ICD-10-CM | POA: Diagnosis not present

## 2017-02-24 DIAGNOSIS — M545 Low back pain: Secondary | ICD-10-CM | POA: Diagnosis not present

## 2017-02-24 DIAGNOSIS — G250 Essential tremor: Secondary | ICD-10-CM | POA: Diagnosis not present

## 2017-02-24 DIAGNOSIS — Z6826 Body mass index (BMI) 26.0-26.9, adult: Secondary | ICD-10-CM | POA: Diagnosis not present

## 2017-02-24 DIAGNOSIS — E784 Other hyperlipidemia: Secondary | ICD-10-CM | POA: Diagnosis not present

## 2017-03-14 ENCOUNTER — Inpatient Hospital Stay (HOSPITAL_COMMUNITY)
Admission: EM | Admit: 2017-03-14 | Discharge: 2017-03-17 | DRG: 246 | Disposition: A | Discharge status: Home / Self Care | Payer: Medicare PPO | Attending: Internal Medicine | Admitting: Internal Medicine

## 2017-03-14 ENCOUNTER — Encounter (HOSPITAL_COMMUNITY): Payer: Self-pay | Admitting: Physician Assistant

## 2017-03-14 ENCOUNTER — Inpatient Hospital Stay (HOSPITAL_COMMUNITY)
Admission: EM | Disposition: A | Discharge status: Home / Self Care | Payer: Self-pay | Source: Home / Self Care | Attending: Internal Medicine

## 2017-03-14 DIAGNOSIS — I472 Ventricular tachycardia: Secondary | ICD-10-CM | POA: Diagnosis not present

## 2017-03-14 DIAGNOSIS — I2119 ST elevation (STEMI) myocardial infarction involving other coronary artery of inferior wall: Secondary | ICD-10-CM | POA: Diagnosis not present

## 2017-03-14 DIAGNOSIS — I255 Ischemic cardiomyopathy: Secondary | ICD-10-CM | POA: Diagnosis present

## 2017-03-14 DIAGNOSIS — I4729 Other ventricular tachycardia: Secondary | ICD-10-CM

## 2017-03-14 DIAGNOSIS — Z8546 Personal history of malignant neoplasm of prostate: Secondary | ICD-10-CM | POA: Diagnosis not present

## 2017-03-14 DIAGNOSIS — I2102 ST elevation (STEMI) myocardial infarction involving left anterior descending coronary artery: Secondary | ICD-10-CM | POA: Diagnosis not present

## 2017-03-14 DIAGNOSIS — N189 Chronic kidney disease, unspecified: Secondary | ICD-10-CM | POA: Diagnosis not present

## 2017-03-14 DIAGNOSIS — G20 Parkinson's disease: Secondary | ICD-10-CM | POA: Diagnosis present

## 2017-03-14 DIAGNOSIS — I213 ST elevation (STEMI) myocardial infarction of unspecified site: Secondary | ICD-10-CM | POA: Diagnosis not present

## 2017-03-14 DIAGNOSIS — E785 Hyperlipidemia, unspecified: Secondary | ICD-10-CM | POA: Diagnosis not present

## 2017-03-14 DIAGNOSIS — I9589 Other hypotension: Secondary | ICD-10-CM | POA: Diagnosis not present

## 2017-03-14 DIAGNOSIS — I2510 Atherosclerotic heart disease of native coronary artery without angina pectoris: Secondary | ICD-10-CM | POA: Diagnosis not present

## 2017-03-14 DIAGNOSIS — I2109 ST elevation (STEMI) myocardial infarction involving other coronary artery of anterior wall: Secondary | ICD-10-CM | POA: Diagnosis not present

## 2017-03-14 DIAGNOSIS — Z955 Presence of coronary angioplasty implant and graft: Secondary | ICD-10-CM | POA: Diagnosis not present

## 2017-03-14 DIAGNOSIS — Z8249 Family history of ischemic heart disease and other diseases of the circulatory system: Secondary | ICD-10-CM | POA: Diagnosis not present

## 2017-03-14 DIAGNOSIS — Z79899 Other long term (current) drug therapy: Secondary | ICD-10-CM | POA: Diagnosis not present

## 2017-03-14 DIAGNOSIS — I5041 Acute combined systolic (congestive) and diastolic (congestive) heart failure: Secondary | ICD-10-CM | POA: Diagnosis not present

## 2017-03-14 DIAGNOSIS — N179 Acute kidney failure, unspecified: Secondary | ICD-10-CM | POA: Diagnosis not present

## 2017-03-14 DIAGNOSIS — R001 Bradycardia, unspecified: Secondary | ICD-10-CM | POA: Diagnosis not present

## 2017-03-14 DIAGNOSIS — R0789 Other chest pain: Secondary | ICD-10-CM | POA: Diagnosis not present

## 2017-03-14 HISTORY — PX: LEFT HEART CATH AND CORONARY ANGIOGRAPHY: CATH118249

## 2017-03-14 HISTORY — DX: Parkinson's disease without dyskinesia, without mention of fluctuations: G20.A1

## 2017-03-14 HISTORY — PX: INTRAVASCULAR ULTRASOUND/IVUS: CATH118244

## 2017-03-14 HISTORY — DX: Parkinson's disease: G20

## 2017-03-14 HISTORY — PX: CORONARY STENT INTERVENTION: CATH118234

## 2017-03-14 LAB — LIPID PANEL
CHOL/HDL RATIO: 4.3 ratio
CHOLESTEROL: 141 mg/dL (ref 0–200)
HDL: 33 mg/dL — AB (ref >40)
LDL Cholesterol: 99 mg/dL (ref 0–99)
Triglycerides: 46 mg/dL (ref <150)
VLDL: 9 mg/dL (ref 0–40)

## 2017-03-14 LAB — COMPREHENSIVE METABOLIC PANEL
ALBUMIN: 3.6 g/dL (ref 3.5–5.0)
ALT: 18 U/L (ref 17–63)
ANION GAP: 10 (ref 5–15)
AST: 74 U/L — AB (ref 15–41)
Alkaline Phosphatase: 64 U/L (ref 38–126)
BILIRUBIN TOTAL: 1.1 mg/dL (ref 0.3–1.2)
BUN: 26 mg/dL — AB (ref 6–20)
CHLORIDE: 104 mmol/L (ref 101–111)
CO2: 19 mmol/L — ABNORMAL LOW (ref 22–32)
Calcium: 8.3 mg/dL — ABNORMAL LOW (ref 8.9–10.3)
Creatinine, Ser: 1.86 mg/dL — ABNORMAL HIGH (ref 0.61–1.24)
GFR calc Af Amer: 39 mL/min — ABNORMAL LOW (ref >60)
GFR calc non Af Amer: 34 mL/min — ABNORMAL LOW (ref >60)
GLUCOSE: 121 mg/dL — AB (ref 65–99)
POTASSIUM: 3.9 mmol/L (ref 3.5–5.1)
Sodium: 133 mmol/L — ABNORMAL LOW (ref 135–145)
TOTAL PROTEIN: 6 g/dL — AB (ref 6.5–8.1)

## 2017-03-14 LAB — TROPONIN I: Troponin I: 6.01 ng/mL (ref <0.03)

## 2017-03-14 LAB — PROTIME-INR
INR: 1.28
PROTHROMBIN TIME: 16.1 s — AB (ref 11.4–15.2)

## 2017-03-14 LAB — POCT I-STAT, CHEM 8
BUN: 27 mg/dL — AB (ref 6–20)
CREATININE: 2 mg/dL — AB (ref 0.61–1.24)
Calcium, Ion: 1.22 mmol/L (ref 1.15–1.40)
Chloride: 104 mmol/L (ref 101–111)
GLUCOSE: 110 mg/dL — AB (ref 65–99)
HCT: 41 % (ref 39.0–52.0)
HEMOGLOBIN: 13.9 g/dL (ref 13.0–17.0)
Potassium: 3.9 mmol/L (ref 3.5–5.1)
Sodium: 138 mmol/L (ref 135–145)
TCO2: 22 mmol/L (ref 0–100)

## 2017-03-14 LAB — CBC
HCT: 39.7 % (ref 39.0–52.0)
Hemoglobin: 13.7 g/dL (ref 13.0–17.0)
MCH: 31.6 pg (ref 26.0–34.0)
MCHC: 34.5 g/dL (ref 30.0–36.0)
MCV: 91.5 fL (ref 78.0–100.0)
PLATELETS: 139 10*3/uL — AB (ref 150–400)
RBC: 4.34 MIL/uL (ref 4.22–5.81)
RDW: 12.9 % (ref 11.5–15.5)
WBC: 9.5 10*3/uL (ref 4.0–10.5)

## 2017-03-14 LAB — POCT ACTIVATED CLOTTING TIME
Activated Clotting Time: 346 seconds
Activated Clotting Time: 296 seconds

## 2017-03-14 LAB — APTT: aPTT: >200 seconds (ref 24–36)

## 2017-03-14 SURGERY — LEFT HEART CATH AND CORONARY ANGIOGRAPHY
Anesthesia: LOCAL

## 2017-03-14 MED ORDER — LIDOCAINE HCL (PF) 1 % IJ SOLN
INTRAMUSCULAR | Status: DC | PRN
  Administered 2017-03-14: 2 mL

## 2017-03-14 MED ORDER — HYDRALAZINE HCL 20 MG/ML IJ SOLN: 5.0000 mg | INTRAMUSCULAR | Status: AC | PRN

## 2017-03-14 MED ORDER — TICAGRELOR 90 MG PO TABS
ORAL_TABLET | ORAL | Status: DC | PRN
  Administered 2017-03-14: 180 mg via ORAL

## 2017-03-14 MED ORDER — METOPROLOL TARTRATE 12.5 MG HALF TABLET
12.5000 mg | ORAL_TABLET | Freq: Two times a day (BID) | ORAL | Status: DC
  Administered 2017-03-14 – 2017-03-15 (×2): 12.5 mg via ORAL
  Filled 2017-03-14 (×2): qty 1

## 2017-03-14 MED ORDER — DOPAMINE-DEXTROSE 3.2-5 MG/ML-% IV SOLN
INTRAVENOUS | Status: AC
  Filled 2017-03-14: qty 250

## 2017-03-14 MED ORDER — SODIUM CHLORIDE 0.9% FLUSH
3.0000 mL | Freq: Two times a day (BID) | INTRAVENOUS | Status: DC
  Administered 2017-03-14: 3 mL via INTRAVENOUS

## 2017-03-14 MED ORDER — TICAGRELOR 90 MG PO TABS
ORAL_TABLET | ORAL | Status: AC
  Filled 2017-03-14: qty 2

## 2017-03-14 MED ORDER — ADENOSINE 6 MG/2ML IV SOLN
INTRAVENOUS | Status: AC
  Filled 2017-03-14: qty 2

## 2017-03-14 MED ORDER — ONDANSETRON HCL 4 MG/2ML IJ SOLN: 4.0000 mg | Freq: Four times a day (QID) | INTRAMUSCULAR | Status: DC | PRN

## 2017-03-14 MED ORDER — HEPARIN SODIUM (PORCINE) 5000 UNIT/ML IJ SOLN
5000.0000 [IU] | Freq: Three times a day (TID) | INTRAMUSCULAR | Status: DC
  Administered 2017-03-15 – 2017-03-17 (×7): 5000 [IU] via SUBCUTANEOUS
  Filled 2017-03-14 (×7): qty 1

## 2017-03-14 MED ORDER — VERAPAMIL HCL 2.5 MG/ML IV SOLN
INTRAVENOUS | Status: DC | PRN
  Administered 2017-03-14: 10 mL via INTRA_ARTERIAL

## 2017-03-14 MED ORDER — ATORVASTATIN CALCIUM 80 MG PO TABS
80.0000 mg | ORAL_TABLET | Freq: Every day | ORAL | Status: DC
  Administered 2017-03-14 – 2017-03-17 (×4): 80 mg via ORAL
  Filled 2017-03-14 (×4): qty 1

## 2017-03-14 MED ORDER — NITROGLYCERIN 1 MG/10 ML FOR IR/CATH LAB
INTRA_ARTERIAL | Status: AC
  Filled 2017-03-14: qty 10

## 2017-03-14 MED ORDER — LIDOCAINE HCL 1 % IJ SOLN
INTRAMUSCULAR | Status: AC
  Filled 2017-03-14: qty 20

## 2017-03-14 MED ORDER — TICAGRELOR 90 MG PO TABS
90.0000 mg | ORAL_TABLET | Freq: Two times a day (BID) | ORAL | Status: DC
  Administered 2017-03-14 – 2017-03-17 (×6): 90 mg via ORAL
  Filled 2017-03-14 (×6): qty 1

## 2017-03-14 MED ORDER — VERAPAMIL HCL 2.5 MG/ML IV SOLN
INTRAVENOUS | Status: AC
  Filled 2017-03-14: qty 2

## 2017-03-14 MED ORDER — ASPIRIN 81 MG PO CHEW
81.0000 mg | CHEWABLE_TABLET | Freq: Every day | ORAL | Status: DC
  Administered 2017-03-15 – 2017-03-17 (×3): 81 mg via ORAL
  Filled 2017-03-14 (×3): qty 1

## 2017-03-14 MED ORDER — ONDANSETRON HCL 4 MG/2ML IJ SOLN
INTRAMUSCULAR | Status: DC | PRN
  Administered 2017-03-14: 4 mg via INTRAVENOUS

## 2017-03-14 MED ORDER — ACETAMINOPHEN 325 MG PO TABS
650.0000 mg | ORAL_TABLET | ORAL | Status: DC | PRN
  Administered 2017-03-14 – 2017-03-15 (×2): 650 mg via ORAL
  Filled 2017-03-14 (×2): qty 2

## 2017-03-14 MED ORDER — SODIUM CHLORIDE 0.9 % IV SOLN: INTRAVENOUS | Status: AC

## 2017-03-14 MED ORDER — HEPARIN (PORCINE) IN NACL 2-0.9 UNIT/ML-% IJ SOLN
INTRAMUSCULAR | Status: AC
  Filled 2017-03-14: qty 1000

## 2017-03-14 MED ORDER — IOPAMIDOL (ISOVUE-370) INJECTION 76%
INTRAVENOUS | Status: AC
  Filled 2017-03-14: qty 50

## 2017-03-14 MED ORDER — SODIUM CHLORIDE 0.9% FLUSH: 3.0000 mL | INTRAVENOUS | Status: DC | PRN

## 2017-03-14 MED ORDER — IOPAMIDOL (ISOVUE-370) INJECTION 76%
INTRAVENOUS | Status: AC
  Filled 2017-03-14: qty 100

## 2017-03-14 MED ORDER — ONDANSETRON HCL 4 MG/2ML IJ SOLN
INTRAMUSCULAR | Status: AC
  Filled 2017-03-14: qty 2

## 2017-03-14 MED ORDER — SODIUM CHLORIDE 0.9 % IV SOLN
INTRAVENOUS | Status: DC | PRN
  Administered 2017-03-14: 500 mL via INTRAVENOUS

## 2017-03-14 MED ORDER — MORPHINE SULFATE (PF) 4 MG/ML IV SOLN: 2.0000 mg | INTRAVENOUS | Status: DC | PRN

## 2017-03-14 MED ORDER — HEPARIN SODIUM (PORCINE) 1000 UNIT/ML IJ SOLN
INTRAMUSCULAR | Status: DC | PRN
  Administered 2017-03-14: 3000 [IU] via INTRAVENOUS
  Administered 2017-03-14: 5000 [IU] via INTRAVENOUS

## 2017-03-14 MED ORDER — TIROFIBAN (AGGRASTAT) BOLUS VIA INFUSION
INTRAVENOUS | Status: DC | PRN
  Administered 2017-03-14: 2350 ug via INTRAVENOUS

## 2017-03-14 MED ORDER — IOPAMIDOL (ISOVUE-370) INJECTION 76%
INTRAVENOUS | Status: DC | PRN
  Administered 2017-03-14: 160 mL via INTRA_ARTERIAL

## 2017-03-14 MED ORDER — TIROFIBAN HCL IN NACL 5-0.9 MG/100ML-% IV SOLN
INTRAVENOUS | Status: DC | PRN
  Administered 2017-03-14: 0.15 ug/kg/min via INTRAVENOUS

## 2017-03-14 MED ORDER — HEPARIN SODIUM (PORCINE) 1000 UNIT/ML IJ SOLN
INTRAMUSCULAR | Status: AC
  Filled 2017-03-14: qty 1

## 2017-03-14 MED ORDER — DOPAMINE-DEXTROSE 3.2-5 MG/ML-% IV SOLN
INTRAVENOUS | Status: DC | PRN
  Administered 2017-03-14: 10 ug/kg/min via INTRAVENOUS

## 2017-03-14 MED ORDER — ADENOSINE (DIAGNOSTIC) FOR INTRACORONARY USE
INTRAVENOUS | Status: DC | PRN
  Administered 2017-03-14 (×4): 30 ug via INTRACORONARY

## 2017-03-14 MED ORDER — SODIUM CHLORIDE 0.9 % IV SOLN: 250.0000 mL | INTRAVENOUS | Status: DC | PRN

## 2017-03-14 MED ORDER — HEPARIN (PORCINE) IN NACL 2-0.9 UNIT/ML-% IJ SOLN
INTRAMUSCULAR | Status: DC | PRN
Start: 2017-03-14 — End: 2017-03-14
  Administered 2017-03-14: 1000 mL

## 2017-03-14 SURGICAL SUPPLY — 27 items
BALLN MOZEC 2.0X12 (BALLOONS) ×2
BALLN ~~LOC~~ EMERGE MR 3.0X20 (BALLOONS) ×2
BALLN ~~LOC~~ EMERGE MR 3.75X15 (BALLOONS) ×2
BALLN ~~LOC~~ MOZEC 2.75X15 (BALLOONS) ×2
BALLOON MOZEC 2.0X12 (BALLOONS) IMPLANT
BALLOON ~~LOC~~ EMERGE MR 3.0X20 (BALLOONS) IMPLANT
BALLOON ~~LOC~~ EMERGE MR 3.75X15 (BALLOONS) IMPLANT
BALLOON ~~LOC~~ MOZEC 2.75X15 (BALLOONS) IMPLANT
CATH INFINITI JR4 5F (CATHETERS) ×1 IMPLANT
CATH LAUNCHER 6FR EBU3.5 (CATHETERS) ×1 IMPLANT
CATH OPTICROSS 40MHZ (CATHETERS) ×1 IMPLANT
DEVICE RAD COMP TR BAND LRG (VASCULAR PRODUCTS) ×1 IMPLANT
ELECT DEFIB PAD ADLT CADENCE (PAD) ×1 IMPLANT
GLIDESHEATH SLEND SS 6F .021 (SHEATH) ×1 IMPLANT
GUIDEWIRE INQWIRE 1.5J.035X260 (WIRE) IMPLANT
INQWIRE 1.5J .035X260CM (WIRE) ×2
KIT ENCORE 26 ADVANTAGE (KITS) ×1 IMPLANT
KIT HEART LEFT (KITS) ×2 IMPLANT
PACK CARDIAC CATHETERIZATION (CUSTOM PROCEDURE TRAY) ×2 IMPLANT
SLED PULL BACK IVUS (MISCELLANEOUS) ×1 IMPLANT
STENT PROMUS PREM MR 2.25X32 (Permanent Stent) ×1 IMPLANT
STENT PROMUS PREM MR 2.75X38 (Permanent Stent) ×1 IMPLANT
STENT PROMUS PREM MR 3.5X12 (Permanent Stent) ×1 IMPLANT
TRANSDUCER W/STOPCOCK (MISCELLANEOUS) ×2 IMPLANT
TUBING CIL FLEX 10 FLL-RA (TUBING) ×2 IMPLANT
WIRE HI TORQ BMW 190CM (WIRE) ×1 IMPLANT
WIRE RUNTHROUGH .014X180CM (WIRE) ×1 IMPLANT

## 2017-03-14 NOTE — Brief Op Note (Signed)
Brief Cardiac Catheterization Note (Full Report to Follow)  Date: 03/14/2017 Time: 4:44 PM  PATIENT:  Drew Sanders  76 y.o. male  PRE-OPERATIVE DIAGNOSIS:  stemi  POST-OPERATIVE DIAGNOSIS:  same  PROCEDURE:  Procedure(s): Left Heart Cath and Coronary Angiography (N/A)  SURGEON:  Surgeon(s) and Role:    * Nelva Bush, MD - Primary  FINDINGS: 1. Diffusely diseased LAD with thrombotic occlusion of the midvessel. 2. Occlusion of inferior branch of OM2, likely not acute. 3. Mild to moderate, non-obstructive CAD involving the rPDA and rPL branches. 4. Mildly elevated left ventricular filling pressure. 5. Successful IVUS-guided PCI to the proximal to distal LAD with three overlapping Promus Premier drug-eluting stents. 6. Transient hypotension in the setting of reperfusion rhythm (AIVR), which resolved IV fluid bolus and brief course of dopamine.  RECOMMENDATIONS: 1. Admit to ICU for post-STEMI monitoring. 2. Obtain transthoracic echocardiogram to evaluate LVEF. 3. DAPT with aspirin and ticagrelor for at least 12 months. 4. Initiate beta-blocker as blood pressure allows. 5. High-intensity statin therapy.  Nelva Bush, MD [facility] Pager: [phone]

## 2017-03-14 NOTE — Progress Notes (Signed)
    RN page.  Blood drawn at cath-  PTT > 200. Will recheck this to verify accuracy  Pt having a very mild discomfort to right mid chest. Does not want any pain medication at this time.I told him we want him to stay comfortable and will PRN pain medication for him.

## 2017-03-14 NOTE — H&P (Signed)
Patient ID: Drew Sanders MRN: [id number], DOB/AGE: [birth year]   Admit date: 03/14/2017   Primary Physician: Neale Burly, MD Primary Cardiologist: Dr. Saunders Revel Reason for admission: acute anterior STEMI  HPI:  Drew Sanders is a 76 y.o. male with a history of HLD, parkinson's dz, prostate cancer and no prior cardiac history who presented to [facility] from [facility] hospital today with anterior STEMI.   He has no past cardiac history and is not followed by a cardiologist. He had a cath remotely (after an abnormal stress test) around 20 years ago. Per patient report, it was normal. He intermittently takes something for cholesterol and takes a Parkinson's drug, but we do not have these records. He denies a history of HTN, DMT2, or CVA. His father had heart disease (MI at 75). He does not smoke or drink.  He was in his usual state of health until this afternoon when he was out doing yard work ([employer]). He had sudden onset of chest pain. No n/v or radiation. He took ASA and he and his girlfriend drove to [facility]. ECG there showed acute anterior STEMI and he was transferred to [facility] via EMS.   Upon arrival to [facility], he continue to have 3-4/10 chest pain. He was taken back for emergent cardiac catheterization.   Problem List  Past Medical History:  Diagnosis Date  . HLD (hyperlipidemia)   . Parkinson disease ([place])   . Prostate cancer [facility])     Past Surgical History:  Procedure Laterality Date  . CARDIAC CATHETERIZATION     normal  . CATARACT EXTRACTION W/PHACO Left 04/06/2015   Procedure: CATARACT EXTRACTION PHACO AND INTRAOCULAR LENS PLACEMENT :  CDE:  9.79;  Surgeon: Tonny Branch, MD;  Location: [facility] ORS;  Service: Ophthalmology;  Laterality: Left;  . CATARACT EXTRACTION W/PHACO Right 04/24/2015   Procedure: CATARACT EXTRACTION PHACO AND INTRAOCULAR LENS PLACEMENT ([place]);  Surgeon: Tonny Branch, MD;  Location: [facility] ORS;  Service: Ophthalmology;  Laterality: Right;  CDE: 12.67  . COLONOSCOPY     . HERNIA REPAIR    . LAPAROSCOPIC RETROPUBIC PROSTATECTOMY       Allergies  No Known Allergies   Home Medications  Prior to Admission medications   Medication Sig Start Date End Date Taking? Authorizing Provider  Multiple Vitamin (MULTIVITAMIN WITH MINERALS) TABS tablet Take 1 tablet by mouth daily.    Historical Provider, MD    Family History  Family History  Problem Relation Age of Onset  . Heart disease Father      Family Status  Relation Status  . Father Other     Social History  Social History   Social History  . Marital status: Divorced    Spouse name: N/A  . Number of children: N/A  . Years of education: N/A   Occupational History  . Not on file.   Social History Main Topics  . Smoking status: Never Smoker  . Smokeless tobacco: Not on file  . Alcohol use Yes     Comment: occ  . Drug use: No  . Sexual activity: Yes   Other Topics Concern  . Not on file   Social History Narrative  . No narrative on file     Review of Systems General:  No chills, fever, night sweats or weight changes.  Cardiovascular:  +++ chest pain, ++dyspnea on exertion, NO edema, orthopnea, palpitations, paroxysmal nocturnal dyspnea. Dermatological: No rash, lesions/masses Respiratory: No cough, dyspnea Urologic: No hematuria, dysuria Abdominal:   No  nausea, vomiting, diarrhea, bright red blood per rectum, melena, or hematemesis Neurologic:  No visual changes, wkns, changes in mental status. All other systems reviewed and are otherwise negative except as noted above.  Physical Exam  There were no vitals taken for this visit.  General: Pleasant, NAD Psych: Normal affect. Neuro: Alert and oriented X 3. Moves all extremities spontaneously. HEENT: Normal  Neck: Supple without bruits or JVD. Lungs:  Resp regular and unlabored, CTA. Heart: RRR no s3, s4, or murmurs. Abdomen: Soft, non-tender, non-distended, BS + x 4.  Extremities: No clubbing, cyanosis or edema.  DP/PT/Radials 2+ and equal bilaterally. Right resting tremor noted  Labs  No results for input(s): CKTOTAL, CKMB, TROPONINI in the last 72 hours. Lab Results  Component Value Date   WBC 8.0 04/03/2015   HGB 14.7 04/03/2015   HCT 43.5 04/03/2015   MCV 92.2 04/03/2015   PLT 173 04/03/2015   No results for input(s): NA, K, CL, CO2, BUN, CREATININE, CALCIUM, PROT, BILITOT, ALKPHOS, ALT, AST, GLUCOSE in the last 168 hours.  Invalid input(s): LABALBU No results found for: CHOL, HDL, LDLCALC, TRIG No results found for: DDIMER   Radiology/Studies  No results found.  ECG  Sinus bradycardia with significant ST elevation in V-V4, upsloping ST depression in inferior leads  ASSESSMENT AND PLAN  Drew Sanders is a 76 y.o. male with a history of HLD, parkinson's dz, and prostate cancer who presented to [facility] from [facility] hospital today with anterior STEMI.   PLAN: emergent cardiac cath with possible PCI. Will get general labs including CBC, BMET, lipids, HgA1c.   Signed, Angelena Form, PA-C 03/14/2017, 3:32 PM  Pager [phone]

## 2017-03-14 NOTE — Progress Notes (Signed)
T. Carlota Raspberry, PA was notified of PTT>200, troponin = 6.01 and pt c/o of mild chest pressure.

## 2017-03-15 ENCOUNTER — Encounter (HOSPITAL_COMMUNITY): Payer: Self-pay | Admitting: *Deleted

## 2017-03-15 ENCOUNTER — Inpatient Hospital Stay (HOSPITAL_COMMUNITY): Payer: Medicare PPO

## 2017-03-15 DIAGNOSIS — G20 Parkinson's disease: Secondary | ICD-10-CM | POA: Diagnosis present

## 2017-03-15 DIAGNOSIS — I213 ST elevation (STEMI) myocardial infarction of unspecified site: Secondary | ICD-10-CM

## 2017-03-15 DIAGNOSIS — E785 Hyperlipidemia, unspecified: Secondary | ICD-10-CM

## 2017-03-15 DIAGNOSIS — Z955 Presence of coronary angioplasty implant and graft: Secondary | ICD-10-CM

## 2017-03-15 LAB — CBC
HCT: 40.8 % (ref 39.0–52.0)
Hemoglobin: 13.9 g/dL (ref 13.0–17.0)
MCH: 31.4 pg (ref 26.0–34.0)
MCHC: 34.1 g/dL (ref 30.0–36.0)
MCV: 92.3 fL (ref 78.0–100.0)
PLATELETS: 143 10*3/uL — AB (ref 150–400)
RBC: 4.42 MIL/uL (ref 4.22–5.81)
RDW: 12.9 % (ref 11.5–15.5)
WBC: 13.3 10*3/uL — AB (ref 4.0–10.5)

## 2017-03-15 LAB — HEMOGLOBIN A1C
HEMOGLOBIN A1C: 5.2 % (ref 4.8–5.6)
MEAN PLASMA GLUCOSE: 103 mg/dL

## 2017-03-15 LAB — BASIC METABOLIC PANEL
Anion gap: 9 (ref 5–15)
BUN: 28 mg/dL — AB (ref 6–20)
CALCIUM: 8.7 mg/dL — AB (ref 8.9–10.3)
CO2: 23 mmol/L (ref 22–32)
CREATININE: 1.89 mg/dL — AB (ref 0.61–1.24)
Chloride: 104 mmol/L (ref 101–111)
GFR, EST AFRICAN AMERICAN: 38 mL/min — AB (ref >60)
GFR, EST NON AFRICAN AMERICAN: 33 mL/min — AB (ref >60)
Glucose, Bld: 113 mg/dL — ABNORMAL HIGH (ref 65–99)
Potassium: 4.3 mmol/L (ref 3.5–5.1)
SODIUM: 136 mmol/L (ref 135–145)

## 2017-03-15 LAB — TROPONIN I: Troponin I: >65 ng/mL (ref <0.03)

## 2017-03-15 LAB — ECHOCARDIOGRAM COMPLETE
HEIGHTINCHES: 73 in
WEIGHTICAEL: 3240 [oz_av]

## 2017-03-15 LAB — APTT: APTT: 35 s (ref 24–36)

## 2017-03-15 MED ORDER — CARBIDOPA-LEVODOPA 25-100 MG PO TABS
1.0000 | ORAL_TABLET | Freq: Three times a day (TID) | ORAL | Status: DC
  Administered 2017-03-15 – 2017-03-17 (×7): 1 via ORAL
  Filled 2017-03-15 (×7): qty 1

## 2017-03-15 NOTE — Progress Notes (Signed)
Patient complains of tingling and numbness in his pointer and middle finger on the right hand. He stated the symptom started after the air was released from the TR band. The cath site and the forearm distal to the site is soft without signs or symptoms of hematoma. There is a small soft bruise noted distal to the puncture site and center of the right forearm. This area is not new, It was noted at the beginning of the shift. The right hand is warm and capillary refill has returned to normal.  Dr. Eula Fried has been notified and order for ice pack obtained. The patient was re-educated on limited movement of the extremity, elevation of the extremity above the heart, and the use of the ice pack. Patient voiced understanding, however, the patient has a tremor in the right hand.

## 2017-03-15 NOTE — Progress Notes (Signed)
.  EKG CRITICAL VALUE     12 lead EKG performed.  Critical value noted.  Antony Haste, RN notified.   SCALES-PRICE,Rasool Rommel, CCT 03/15/2017 07:02AM

## 2017-03-15 NOTE — Progress Notes (Signed)
CRITICAL VALUE ALERT  Critical value received:  Trop >65  Date of notification:  03/15/2017  Time of notification:  4944  Critical value read back:Yes.    Nurse who received alert:  Lesli Albee  Expected value, pt no longer has CP at this time. Asymptomatic, pt had 3 DES placed in cath lab 03/14/2017

## 2017-03-15 NOTE — Progress Notes (Signed)
Patient states the ice pack helped with the numbness and tingling in his first two fingers on the right hand. Will continue to monitor.

## 2017-03-15 NOTE — Progress Notes (Signed)
DAILY PROGRESS NOTE   Patient Name: Drew Sanders Date of Encounter: 03/15/2017  Hospital Problem List   Principal Problem:   STEMI (ST elevation myocardial infarction) [facility]) Active Problems:   STEMI involving left anterior descending coronary artery ([place])   Dyslipidemia   Parkinson's disease ([place])   S/P primary angioplasty with coronary stent    Chief Complaint   Some chest pressure overnight, improved with morphine   Subjective   He feels well now, no complaints of chest pain. Given pain medicine overnight. Had some numbness and tingling of the right hand, better after TR band removed and ice pack applied. Troponin elevated >65.  Objective   Vitals:   03/15/17 0600 03/15/17 0630 03/15/17 0700 03/15/17 0755  BP: 102/75 113/67 98/66   Pulse: 61 (!) 186 (!) 54   Resp: '17 17 15   ' Temp:    97.8 F (36.6 C)  TempSrc:    Oral  SpO2: 96% 97% 96%   Weight:      Height:        Intake/Output Summary (Last 24 hours) at 03/15/17 5284 Last data filed at 03/15/17 0200  Gross per 24 hour  Intake              530 ml  Output             1225 ml  Net             -695 ml   Filed Weights   03/14/17 1713  Weight: 202 lb 8 oz (91.9 kg)    Physical Exam   General appearance: alert, no distress and lying supine Neck: no carotid bruit and no JVD Lungs: clear to auscultation bilaterally Heart: regular rate and rhythm, S1, S2 normal, no murmur, click, rub or gallop Abdomen: soft, non-tender; bowel sounds normal; no masses,  no organomegaly Extremities: extremities normal, atraumatic, no cyanosis or edema and no radial hematoma Pulses: 2+ and symmetric Skin: Skin color, texture, turgor normal. No rashes or lesions Neurologic: Grossly normal Psych: Pleasant  Inpatient Medications    Scheduled Meds: . aspirin  81 mg Oral Daily  . atorvastatin  80 mg Oral q1800  . heparin  5,000 Units Subcutaneous Q8H  . metoprolol tartrate  12.5 mg Oral BID  . sodium chloride flush  3 mL  Intravenous Q12H  . ticagrelor  90 mg Oral BID    Continuous Infusions: . sodium chloride Stopped (03/14/17 2150)    PRN Meds: sodium chloride, acetaminophen, morphine injection, ondansetron (ZOFRAN) IV, sodium chloride flush   Labs   Results for orders placed or performed during the hospital encounter of 03/14/17 (from the past 48 hour(s))  I-STAT, chem 8     Status: Abnormal   Collection Time: 03/14/17  3:14 PM  Result Value Ref Range   Sodium 138 135 - 145 mmol/L   Potassium 3.9 3.5 - 5.1 mmol/L   Chloride 104 101 - 111 mmol/L   BUN 27 (H) 6 - 20 mg/dL   Creatinine, Ser 2.00 (H) 0.61 - 1.24 mg/dL   Glucose, Bld 110 (H) 65 - 99 mg/dL   Calcium, Ion 1.22 1.15 - 1.40 mmol/L   TCO2 22 0 - 100 mmol/L   Hemoglobin 13.9 13.0 - 17.0 g/dL   HCT 41.0 39.0 - 52.0 %  POCT Activated clotting time     Status: None   Collection Time: 03/14/17  3:23 PM  Result Value Ref Range   Activated Clotting Time 346 seconds  POCT Activated  clotting time     Status: None   Collection Time: 03/14/17  4:05 PM  Result Value Ref Range   Activated Clotting Time 296 seconds  Comprehensive metabolic panel     Status: Abnormal   Collection Time: 03/14/17  4:33 PM  Result Value Ref Range   Sodium 133 (L) 135 - 145 mmol/L   Potassium 3.9 3.5 - 5.1 mmol/L   Chloride 104 101 - 111 mmol/L   CO2 19 (L) 22 - 32 mmol/L   Glucose, Bld 121 (H) 65 - 99 mg/dL   BUN 26 (H) 6 - 20 mg/dL   Creatinine, Ser 1.86 (H) 0.61 - 1.24 mg/dL   Calcium 8.3 (L) 8.9 - 10.3 mg/dL   Total Protein 6.0 (L) 6.5 - 8.1 g/dL   Albumin 3.6 3.5 - 5.0 g/dL   AST 74 (H) 15 - 41 U/L   ALT 18 17 - 63 U/L   Alkaline Phosphatase 64 38 - 126 U/L   Total Bilirubin 1.1 0.3 - 1.2 mg/dL   GFR calc non Af Amer 34 (L) >60 mL/min   GFR calc Af Amer 39 (L) >60 mL/min    Comment: (NOTE) The eGFR has been calculated using the CKD EPI equation. This calculation has not been validated in all clinical situations. eGFR's persistently <60 mL/min  signify possible Chronic Kidney Disease.    Anion gap 10 5 - 15  Lipid panel     Status: Abnormal   Collection Time: 03/14/17  4:33 PM  Result Value Ref Range   Cholesterol 141 0 - 200 mg/dL   Triglycerides 46 <150 mg/dL   HDL 33 (L) >40 mg/dL   Total CHOL/HDL Ratio 4.3 RATIO   VLDL 9 0 - 40 mg/dL   LDL Cholesterol 99 0 - 99 mg/dL    Comment:        Total Cholesterol/HDL:CHD Risk Coronary Heart Disease Risk Table                     Men   Women  1/2 Average Risk   3.4   3.3  Average Risk       5.0   4.4  2 X Average Risk   9.6   7.1  3 X Average Risk  23.4   11.0        Use the calculated Patient Ratio above and the CHD Risk Table to determine the patient's CHD Risk.        ATP III CLASSIFICATION (LDL):  <100     mg/dL   Optimal  100-129  mg/dL   Near or Above                    Optimal  130-159  mg/dL   Borderline  160-189  mg/dL   High  >190     mg/dL   Very High   Hemoglobin A1c     Status: None   Collection Time: 03/14/17  4:33 PM  Result Value Ref Range   Hgb A1c MFr Bld 5.2 4.8 - 5.6 %    Comment: (NOTE)         Pre-diabetes: 5.7 - 6.4         Diabetes: >6.4         Glycemic control for adults with diabetes: <7.0    Mean Plasma Glucose 103 mg/dL    Comment: (NOTE) Performed At: [facility] [place], [place] [id number] Lindon Romp MD [id number]  CBC     Status: Abnormal   Collection Time: 03/14/17  4:33 PM  Result Value Ref Range   WBC 9.5 4.0 - 10.5 K/uL   RBC 4.34 4.22 - 5.81 MIL/uL   Hemoglobin 13.7 13.0 - 17.0 g/dL   HCT 39.7 39.0 - 52.0 %   MCV 91.5 78.0 - 100.0 fL   MCH 31.6 26.0 - 34.0 pg   MCHC 34.5 30.0 - 36.0 g/dL   RDW 12.9 11.5 - 15.5 %   Platelets 139 (L) 150 - 400 K/uL  Protime-INR     Status: Abnormal   Collection Time: 03/14/17  4:33 PM  Result Value Ref Range   Prothrombin Time 16.1 (H) 11.4 - 15.2 seconds   INR 1.28   APTT     Status: Abnormal   Collection Time: 03/14/17  4:33 PM  Result Value  Ref Range   aPTT >200 (HH) 24 - 36 seconds    Comment:        IF BASELINE aPTT IS ELEVATED, SUGGEST PATIENT RISK ASSESSMENT BE USED TO DETERMINE APPROPRIATE ANTICOAGULANT THERAPY. REPEATED TO VERIFY CRITICAL RESULT CALLED TO, READ BACK BY AND VERIFIED WITH: K SHARP,RN 1739 03/14/17 D BRADLEY   Troponin I     Status: Abnormal   Collection Time: 03/14/17  4:33 PM  Result Value Ref Range   Troponin I 6.01 (HH) <0.03 ng/mL    Comment: CRITICAL RESULT CALLED TO, READ BACK BY AND VERIFIED WITH: K. [facility] RN [id number] GREEN R   Basic metabolic panel     Status: Abnormal   Collection Time: 03/15/17  3:24 AM  Result Value Ref Range   Sodium 136 135 - 145 mmol/L   Potassium 4.3 3.5 - 5.1 mmol/L   Chloride 104 101 - 111 mmol/L   CO2 23 22 - 32 mmol/L   Glucose, Bld 113 (H) 65 - 99 mg/dL   BUN 28 (H) 6 - 20 mg/dL   Creatinine, Ser 1.89 (H) 0.61 - 1.24 mg/dL   Calcium 8.7 (L) 8.9 - 10.3 mg/dL   GFR calc non Af Amer 33 (L) >60 mL/min   GFR calc Af Amer 38 (L) >60 mL/min    Comment: (NOTE) The eGFR has been calculated using the CKD EPI equation. This calculation has not been validated in all clinical situations. eGFR's persistently <60 mL/min signify possible Chronic Kidney Disease.    Anion gap 9 5 - 15  CBC     Status: Abnormal   Collection Time: 03/15/17  3:24 AM  Result Value Ref Range   WBC 13.3 (H) 4.0 - 10.5 K/uL   RBC 4.42 4.22 - 5.81 MIL/uL   Hemoglobin 13.9 13.0 - 17.0 g/dL   HCT 40.8 39.0 - 52.0 %   MCV 92.3 78.0 - 100.0 fL   MCH 31.4 26.0 - 34.0 pg   MCHC 34.1 30.0 - 36.0 g/dL   RDW 12.9 11.5 - 15.5 %   Platelets 143 (L) 150 - 400 K/uL  Troponin I     Status: Abnormal   Collection Time: 03/15/17  3:24 AM  Result Value Ref Range   Troponin I >65.00 (HH) <0.03 ng/mL    Comment: CRITICAL RESULT CALLED TO, READ BACK BY AND VERIFIED WITH: STOWE,S RN 03/15/2017 0422 JORDANS   APTT     Status: None   Collection Time: 03/15/17  3:24 AM  Result Value Ref Range   aPTT  35 24 - 36 seconds    ECG   Sinus bradycardia at  52, low voltage QRS, anteroseptal infarct pattern - Personally Reviewed  Telemetry   Sinus bradycardia - Personally Reviewed  Radiology    No results found.  Cardiac Studies   Pending echo  Assessment   Principal Problem:   STEMI (ST elevation myocardial infarction) ([place]) Active Problems:   STEMI involving left anterior descending coronary artery (HCC)   Dyslipidemia   Parkinson's disease ([place])   S/P primary angioplasty with coronary stent   Plan   1. Feels better today - had some chest pressure overnight. Vitals stable. On aspirin and Brillinta, lipitor 80 mg QHS (LDL 99) and low dose metoprolol (HR in 50's). Work with cardiac rehab today. Plan for 2D echo. Anticipate possible d/c tomorrow.  Time Spent Directly with Patient:  15 minutes  Length of Stay:  LOS: 1 day   Pixie Casino, MD, [place]  Attending Cardiologist  Direct Dial: [phone]  Fax: [phone]  Website:  [email or web address]  Nadean Corwin Korrina Zern 03/15/2017, 8:14 AM

## 2017-03-15 NOTE — Progress Notes (Signed)
  Echocardiogram 2D Echocardiogram has been performed.  Tresa Res 03/15/2017, 2:23 PM

## 2017-03-15 NOTE — Progress Notes (Signed)
CARDIAC REHAB PHASE I   PRE:  Rate/Rhythm: 62  BP:  Sitting: 103/87     SaO2: 95% 1 liter  MODE:  Ambulation: 350 ft   POST:  Rate/Rhythm: 61  BP:  Sitting: 121/78     SaO2: 97% 1 liter  10:55am-11:50am Patient ambulated with x 1 assist. No breaks needed. Slow and steady. Education completed. Interested in rehab at [facility]. Will place referral. Family at bedside.  Dickenson, MS 03/15/2017 11:44 AM

## 2017-03-16 DIAGNOSIS — I5041 Acute combined systolic (congestive) and diastolic (congestive) heart failure: Secondary | ICD-10-CM | POA: Diagnosis present

## 2017-03-16 DIAGNOSIS — I4729 Other ventricular tachycardia: Secondary | ICD-10-CM

## 2017-03-16 DIAGNOSIS — I255 Ischemic cardiomyopathy: Secondary | ICD-10-CM | POA: Diagnosis present

## 2017-03-16 DIAGNOSIS — I472 Ventricular tachycardia: Secondary | ICD-10-CM

## 2017-03-16 LAB — BASIC METABOLIC PANEL
Anion gap: 7 (ref 5–15)
BUN: 28 mg/dL — ABNORMAL HIGH (ref 6–20)
CHLORIDE: 104 mmol/L (ref 101–111)
CO2: 23 mmol/L (ref 22–32)
Calcium: 8.5 mg/dL — ABNORMAL LOW (ref 8.9–10.3)
Creatinine, Ser: 1.87 mg/dL — ABNORMAL HIGH (ref 0.61–1.24)
GFR calc non Af Amer: 33 mL/min — ABNORMAL LOW (ref >60)
GFR, EST AFRICAN AMERICAN: 39 mL/min — AB (ref >60)
Glucose, Bld: 107 mg/dL — ABNORMAL HIGH (ref 65–99)
Potassium: 4.2 mmol/L (ref 3.5–5.1)
Sodium: 134 mmol/L — ABNORMAL LOW (ref 135–145)

## 2017-03-16 LAB — TROPONIN I: Troponin I: 53.67 ng/mL (ref <0.03)

## 2017-03-16 MED ORDER — TICAGRELOR 90 MG PO TABS: 90.0000 mg | ORAL_TABLET | Freq: Two times a day (BID) | ORAL | 3 refills | Status: DC

## 2017-03-16 MED ORDER — ASPIRIN 81 MG PO CHEW: 81.0000 mg | CHEWABLE_TABLET | Freq: Every day | ORAL | Status: AC

## 2017-03-16 MED ORDER — ACETAMINOPHEN 325 MG PO TABS: 650.0000 mg | ORAL_TABLET | ORAL | Status: DC | PRN

## 2017-03-16 MED ORDER — METOPROLOL SUCCINATE ER 25 MG PO TB24: 25.0000 mg | ORAL_TABLET | Freq: Every day | ORAL | 3 refills | Status: DC

## 2017-03-16 MED ORDER — NITROGLYCERIN 0.4 MG SL SUBL: 0.4000 mg | SUBLINGUAL_TABLET | SUBLINGUAL | 2 refills | Status: AC | PRN

## 2017-03-16 MED ORDER — ATORVASTATIN CALCIUM 80 MG PO TABS: 80.0000 mg | ORAL_TABLET | Freq: Every day | ORAL | 3 refills | Status: DC

## 2017-03-16 MED ORDER — NITROGLYCERIN 0.4 MG SL SUBL: 0.4000 mg | SUBLINGUAL_TABLET | SUBLINGUAL | Status: DC | PRN

## 2017-03-16 MED ORDER — METOPROLOL SUCCINATE ER 25 MG PO TB24
25.0000 mg | ORAL_TABLET | Freq: Every day | ORAL | Status: DC
  Administered 2017-03-16 – 2017-03-17 (×2): 25 mg via ORAL
  Filled 2017-03-16 (×2): qty 1

## 2017-03-16 NOTE — Progress Notes (Signed)
   LifeVest order was submitted earlier today. I've spoken with device rep for update. We are still awaiting approval from [facility]. If approved today, device rep will fit later this evening. However approval may not be granted until tomorrow. Keep inpatient until fitted. Pt notified of pending status.   Drew Sanders 03/16/2017

## 2017-03-16 NOTE — Progress Notes (Signed)
Patient arrived in the unit accompanied by RN ambulatory. Orientation to the unit given. Patient verbalizes understanding.

## 2017-03-16 NOTE — Discharge Summary (Signed)
Patient ID: Drew Sanders,  MRN: [id number], DOB/AGE: Apr 19, 1941 77 y.o.  Admit date: 03/14/2017 Discharge date: 03/17/17  Primary Care Provider: Neale Burly, MD Primary Cardiologist: Linna Hoff  Discharge Diagnoses Principal Problem:   STEMI involving left anterior descending coronary artery [facility]) Active Problems:   Dyslipidemia   Parkinson's disease ([place])   S/P primary angioplasty with coronary stent   Acute combined systolic and diastolic heart failure (HCC)   NSVT (nonsustained ventricular tachycardia) ([place])   Ischemic cardiomyopathy    Procedures: Urgent cath/ PCI 03/14/17   Hospital Course 76 year old man with history of hyperlipidemia, Parkinson's disease, and prostate cancer, who developed acute onset of chest pain at approximately 1:30 in the afternoon on 03/14/17 while weed eating. He proceeded to [facility] and was found to have anterior ST segment elevation consistent with STEMI. Emergent transport to [facility] was arranged for left heart catheterization and primary PCI. He was found to have severe diffuse disease of the LAD, including thrombotic occlusion of the mid vessel. This was successfully treated with 3 overlapping drug-eluting stents. Disease involving the first 2 diagonal branches as well as OM2 was also noted. Procedure was complicated by transient hypotension in the setting of accelerated idioventricular rhythm that briefly required dopamine infusion. This was weaned off by the end of the procedure. His LVEDP was elevated. Echo showed an EF of 30-35%. He has  akinesis of the mid to apical anterior, mid anteroseptal, mid anterolateral, apical septal, apical lateral, and apical myocardium.  He also has episodes of NSVT on telemetry. He is at high risk for ventricular tachyarrhythmias. We will plan to have a LifeVest fitted prior to discharge. He will need a repeat echo in 3 months with ICD implantation if there's no improvement in his LVEF. Blood pressure is low.   Toprol  25 mg daily added. Add an ACE-I/ARB/ARNI as tolerated by BP as an OP.  He was switched from lovastatin to atorvastatin this admission will need repeat lipids and CMP in 6 weeks. Continue aspirin and ticagrelor for at least one year. He is also at risk for LV thrombus. However, Dr Oval Linsey felt that given his Parkinson's disease the risk of triple therapy outweighs the potential benefit. At discharge the pt, who lives in [place], said he would like to f/u in the [place] office in the short term and be eventually followed at the [place]. We will arrange a TOC office visit in the [place] office in 7-14 days. The pt was held till a Life Vest could be arranged. He was discharge late on 03/17/17.    Discharge Vitals:  Blood pressure 104/66, pulse (!) 57, temperature 97.7 F (36.5 C), temperature source Oral, resp. rate 18, height 6\' 1"  (1.854 m), weight 195 lb 6.4 oz (88.6 kg), SpO2 96 %.    Labs: No results found for this or any previous visit (from the past 24 hour(s)).  Disposition:  Follow-up Information    Lendon Colonel, NP. Go on 03/26/2017.   Specialties:  Nurse Practitioner, Radiology, Cardiology Why:  @1pm  for TCM Contact information: [place] [address] [phone]           Discharge Medications:  Allergies as of 03/17/2017   No Known Allergies     Medication List    STOP taking these medications   ibuprofen 200 MG tablet Commonly known as:  ADVIL,MOTRIN   lovastatin 10 MG tablet Commonly known as:  MEVACOR     TAKE these medications  acetaminophen 325 MG tablet Commonly known as:  TYLENOL Take 2 tablets (650 mg total) by mouth every 4 (four) hours as needed for headache or mild pain.   aspirin 81 MG chewable tablet Chew 1 tablet (81 mg total) by mouth daily.   atorvastatin 80 MG tablet Commonly known as:  LIPITOR Take 1 tablet (80 mg total) by mouth daily at 6 PM.   carbidopa-levodopa 25-100 MG tablet Commonly known as:  SINEMET  IR Take 1 tablet by mouth 3 (three) times daily after meals.   FISH OIL PO Take 1 capsule by mouth at bedtime.   metoprolol succinate 25 MG 24 hr tablet Commonly known as:  TOPROL-XL Take 1 tablet (25 mg total) by mouth daily.   multivitamin with minerals Tabs tablet Take 1 tablet by mouth daily.   nitroGLYCERIN 0.4 MG SL tablet Commonly known as:  NITROSTAT Place 1 tablet (0.4 mg total) under the tongue every 5 (five) minutes as needed for chest pain.   ticagrelor 90 MG Tabs tablet Commonly known as:  BRILINTA Take 1 tablet (90 mg total) by mouth 2 (two) times daily.        Duration of Discharge Encounter: Greater than 30 minutes including physician time.  Angelena Form PA-C 03/18/2017 10:12 AM

## 2017-03-16 NOTE — Progress Notes (Signed)
Progress Note  Patient Name: Drew Sanders Date of Encounter: 03/16/2017  Primary Cardiologist: Dr. Saunders Revel  Subjective   Feeling well. Wants to home. Had no chest pain or shortness of breath with ambulation.  Inpatient Medications    Scheduled Meds: . aspirin  81 mg Oral Daily  . atorvastatin  80 mg Oral q1800  . carbidopa-levodopa  1 tablet Oral TID  . heparin  5,000 Units Subcutaneous Q8H  . metoprolol tartrate  12.5 mg Oral BID  . ticagrelor  90 mg Oral BID   Continuous Infusions: . sodium chloride Stopped (03/14/17 2150)   PRN Meds: sodium chloride, acetaminophen, morphine injection, ondansetron (ZOFRAN) IV, sodium chloride flush   Vital Signs    Vitals:   03/16/17 0319 03/16/17 0400 03/16/17 0500 03/16/17 0600  BP:  117/75 (!) 102/57 (!) 114/57  Pulse:  (!) 59 (!) 56 64  Resp:  13 14 18   Temp: 98 F (36.7 C)     TempSrc: Oral     SpO2:  96% 96% 95%  Weight:      Height:        Intake/Output Summary (Last 24 hours) at 03/16/17 0745 Last data filed at 03/16/17 0700  Gross per 24 hour  Intake              340 ml  Output             1200 ml  Net             -860 ml   Filed Weights   03/14/17 1713  Weight: 91.9 kg (202 lb 8 oz)    Telemetry    Sinus rhythm. PVCs and an SVT up to 6 beats. - Personally Reviewed  ECG    n/a - Personally Reviewed  Physical Exam   GEN: No acute distress.   Neck: No JVD Cardiac: RRR, no murmurs, rubs, or gallops.  Respiratory: Clear to auscultation bilaterally. GI: Soft, nontender, non-distended  MS: No edema; No deformity. Neuro:  Nonfocal.  Pill-rolling tremor of the right hand. Psych: Normal affect   Labs    Chemistry Recent Labs Lab 03/14/17 1514 03/14/17 1633 03/15/17 0324  NA 138 133* 136  K 3.9 3.9 4.3  CL 104 104 104  CO2  --  19* 23  GLUCOSE 110* 121* 113*  BUN 27* 26* 28*  CREATININE 2.00* 1.86* 1.89*  CALCIUM  --  8.3* 8.7*  PROT  --  6.0*  --   ALBUMIN  --  3.6  --   AST  --  74*  --     ALT  --  18  --   ALKPHOS  --  64  --   BILITOT  --  1.1  --   GFRNONAA  --  34* 33*  GFRAA  --  39* 38*  ANIONGAP  --  10 9     Hematology Recent Labs Lab 03/14/17 1514 03/14/17 1633 03/15/17 0324  WBC  --  9.5 13.3*  RBC  --  4.34 4.42  HGB 13.9 13.7 13.9  HCT 41.0 39.7 40.8  MCV  --  91.5 92.3  MCH  --  31.6 31.4  MCHC  --  34.5 34.1  RDW  --  12.9 12.9  PLT  --  139* 143*    Cardiac Enzymes Recent Labs Lab 03/14/17 1633 03/15/17 0324 03/15/17 1155 03/16/17 0214  TROPONINI 6.01* >65.00* >65.00* 53.67*   No results for input(s): TROPIPOC in the last 168 hours.  BNPNo results for input(s): BNP, PROBNP in the last 168 hours.   DDimer No results for input(s): DDIMER in the last 168 hours.   Radiology    No results found.  Cardiac Studies   Echo 03/16/17:  Study Conclusions  - Left ventricle: The cavity size was normal. Wall thickness was   increased increased in a pattern of mild to moderate LVH.   Systolic function was moderately to severely reduced. The   estimated ejection fraction was in the range of 30% to 35%. - Regional wall motion abnormality: Akinesis of the mid-apical   anterior, mid anteroseptal, mid anterolateral, apical septal,   apical lateral, and apical myocardium. DIastolic function is   abnormal, indeterminant grade. - Aortic valve: Valve area (VTI): 2.92 cm^2. Valve area (Vmax):   2.58 cm^2. Valve area (Vmean): 2.89 cm^2. - Atrial septum: No defect or patent foramen ovale was identified. - Pulmonary arteries: Systolic pressure was moderately increased.   PA peak pressure: 40 mm Hg (S).  LHC 03/14/17: Conclusions: 1. Anterior STEMI with diffusely diseased LAD and thrombotic subtotal occlusion of the mid vessel with TIMI one flow. 2. Occlusion of inferior segment of OM2 branch, likely subacute or chronic given to left collaterals. 3. Mild to moderate, nonobstructive disease involving the RCA. 4. Mildly elevated left ventricular  filling pressure. 5. Successful IVUS guided PCI to the proximal through distal LAD with placement of 3 Promus drug-eluting stents.  Recommendations: 1. Admit to ICU for post-STEMI monitoring. 2. Obtain transthoracic echocardiogram to evaluate LVEF. 3. DAPT with aspirin and ticagrelor for at least 12 months. 4. Initiate beta-blocker as blood pressure allows. 5. High-intensity statin therapy.  Patient Profile     76 y.o. male  with Parkinson's disease and hyperlipidemia admitted with anterior ST elevation MI. Now status post coronary stent and found to have acute systolic and diastolic heart failure with LVEF 30-35%.  Assessment & Plan    [name] is doing well clinically area however his LVEF is 30-35% on echo. He has  akinesis of the mid to apical anterior, mid anteroseptal,  mid anterolateral, apical septal, apical lateral, and apical myocardium.  He also has episodes of NSVT on telemetry. He is at high risk for ventricular tachyarrhythmias. We will plan to have a LifeVest fitted prior to discharge. He will need a repeat echo in 3 months with ICD implantation if there's no improvement in his LVEF. Blood pressure is low. We will consolidate metoprolol to succinate 25 mg daily. Add an ACE-I/ARB/ARNI as tolerated by BP.  He was switched from lovastatin to atorvastatin this admission will need repeat lipids and CMP in 6 weeks. Continue aspirin and ticagrelor for at least one year. He is also at risk for LV thrombus. However given his Parkinson's disease the risk of triple therapy outweighs the potential benefit.   Signed, Skeet Latch, MD  03/16/2017, 7:45 AM

## 2017-03-16 NOTE — Progress Notes (Signed)
Report called to 3E RN. 

## 2017-03-16 NOTE — Discharge Instructions (Signed)
Coronary Angiogram With Stent, Care After °This sheet gives you information about how to care for yourself after your procedure. Your health care provider may also give you more specific instructions. If you have problems or questions, contact your health care provider. °What can I expect after the procedure? °After your procedure, it is common to have: °· Bruising in the area where a small, thin tube (catheter) was inserted. This usually fades within 1-2 weeks. °· Blood collecting in the tissue (hematoma) that may be painful to the touch. It should usually decrease in size and tenderness within 1-2 weeks. °Follow these instructions at home: °Insertion area care  °· Do not take baths, swim, or use a hot tub until your health care provider approves. °· You may shower 24-48 hours after the procedure or as directed by your health care provider. °· Follow instructions from your health care provider about how to take care of your incision. Make sure you: °¨ Wash your hands with soap and water before you change your bandage (dressing). If soap and water are not available, use hand sanitizer. °¨ Change your dressing as told by your health care provider. °¨ Leave stitches (sutures), skin glue, or adhesive strips in place. These skin closures may need to stay in place for 2 weeks or longer. If adhesive strip edges start to loosen and curl up, you may trim the loose edges. Do not remove adhesive strips completely unless your health care provider tells you to do that. °· Remove the bandage (dressing) and gently wash the catheter insertion site with plain soap and water. °· Pat the area dry with a clean towel. Do not rub the area, because that may cause bleeding. °· Do not apply powder or lotion to the incision area. °· Check your incision area every day for signs of infection. Check for: °¨ More redness, swelling, or pain. °¨ More fluid or blood. °¨ Warmth. °¨ Pus or a bad smell. °Activity  °· Do not drive for 24 hours if you  were given a medicine to help you relax (sedative). °· Do not lift anything that is heavier than 10 lb (4.5 kg) for 5 days after your procedure or as directed by your health care provider. °· Ask your health care provider when it is okay for you: °¨ To return to work or school. °¨ To resume usual physical activities or sports. °¨ To resume sexual activity. °Eating and drinking  °· Eat a heart-healthy diet. This should include plenty of fresh fruits and vegetables. °· Avoid the following types of food: °¨ Food that is high in salt. °¨ Canned or highly processed food. °¨ Food that is high in saturated fat or sugar. °¨ Fried food. °· Limit alcohol intake to no more than 1 drink a day for non-pregnant women and 2 drinks a day for men. One drink equals 12 oz of beer, 5 oz of wine, or 1½ oz of hard liquor. °Lifestyle  °· Do not use any products that contain nicotine or tobacco, such as cigarettes and e-cigarettes. If you need help quitting, ask your health care provider. °· Take steps to manage and control your weight. °· Get regular exercise. °· Manage your blood pressure. °· Manage other health problems, such as diabetes. °General instructions  °· Take over-the-counter and prescription medicines only as told by your health care provider. Blood thinners may be prescribed after your procedure to improve blood flow through the stent. °· If you need an MRI after your heart stent   has been placed, be sure to tell the health care provider who orders the MRI that you have a heart stent. °· Keep all follow-up visits as directed by your health care provider. This is important. °Contact a health care provider if: °· You have a fever. °· You have chills. °· You have increased bleeding from the catheter insertion area. Hold pressure on the area. °Get help right away if: °· You develop chest pain or shortness of breath. °· You feel faint or you pass out. °· You have unusual pain at the catheter insertion area. °· You have redness,  warmth, or swelling at the catheter insertion area. °· You have drainage (other than a small amount of blood on the dressing) from the catheter insertion area. °· The catheter insertion area is bleeding, and the bleeding does not stop after 30 minutes of holding steady pressure on the area. °· You develop bleeding from any other place, such as from your rectum. There may be bright red blood in your urine or stool, or it may appear as black, tarry stool. °This information is not intended to replace advice given to you by your health care provider. Make sure you discuss any questions you have with your health care provider. °Document Released: 05/17/2005 Document Revised: 07/25/2016 Document Reviewed: 07/25/2016 °Elsevier Interactive Patient Education © 2017 [employer] ° °

## 2017-03-17 ENCOUNTER — Encounter (HOSPITAL_COMMUNITY): Payer: Self-pay | Admitting: Internal Medicine

## 2017-03-17 DIAGNOSIS — I255 Ischemic cardiomyopathy: Secondary | ICD-10-CM

## 2017-03-17 DIAGNOSIS — G20 Parkinson's disease: Secondary | ICD-10-CM

## 2017-03-17 DIAGNOSIS — I5041 Acute combined systolic (congestive) and diastolic (congestive) heart failure: Secondary | ICD-10-CM

## 2017-03-17 DIAGNOSIS — Z955 Presence of coronary angioplasty implant and graft: Secondary | ICD-10-CM

## 2017-03-17 DIAGNOSIS — I472 Ventricular tachycardia: Secondary | ICD-10-CM

## 2017-03-17 LAB — BASIC METABOLIC PANEL
Anion gap: 8 (ref 5–15)
BUN: 22 mg/dL — AB (ref 6–20)
CHLORIDE: 104 mmol/L (ref 101–111)
CO2: 21 mmol/L — AB (ref 22–32)
CREATININE: 1.68 mg/dL — AB (ref 0.61–1.24)
Calcium: 8.3 mg/dL — ABNORMAL LOW (ref 8.9–10.3)
GFR calc non Af Amer: 38 mL/min — ABNORMAL LOW (ref >60)
GFR, EST AFRICAN AMERICAN: 44 mL/min — AB (ref >60)
Glucose, Bld: 106 mg/dL — ABNORMAL HIGH (ref 65–99)
Potassium: 3.7 mmol/L (ref 3.5–5.1)
SODIUM: 133 mmol/L — AB (ref 135–145)

## 2017-03-17 MED FILL — Nitroglycerin IV Soln 100 MCG/ML in D5W: INTRA_ARTERIAL | Qty: 10 | Status: AC

## 2017-03-17 NOTE — Progress Notes (Signed)
Still awaiting on authorization for Life Vest per Joneen Roach RN,MHA,BSn [phone]

## 2017-03-17 NOTE — Progress Notes (Signed)
Progress Note  Patient Name: Drew Sanders Date of Encounter: 03/17/2017  Primary Cardiologist: New to Dr[facility] Saunders Revel ([facility])  Subjective   Feeling well. No chest pain, sob or palpitations. Pending insurance approval from [facility] for [facility]. He is worried about cost. He is thinking about getting LifeVest from [place]. Ambulating well with Cardiac Rehab.   Inpatient Medications    Scheduled Meds: . aspirin  81 mg Oral Daily  . atorvastatin  80 mg Oral q1800  . carbidopa-levodopa  1 tablet Oral TID  . heparin  5,000 Units Subcutaneous Q8H  . metoprolol succinate  25 mg Oral Daily  . ticagrelor  90 mg Oral BID   Continuous Infusions: . sodium chloride Stopped (03/14/17 2150)   PRN Meds: sodium chloride, acetaminophen, morphine injection, nitroGLYCERIN, ondansetron (ZOFRAN) IV, sodium chloride flush   Vital Signs    Vitals:   03/16/17 1021 03/16/17 1047 03/16/17 2009 03/17/17 0413  BP: (!) 103/92 (!) 120/94 121/90 109/66  Pulse: 66 (!) 59 65 67  Resp:  (!) 22 20 20   Temp:  98.2 F (36.8 C) 99 F (37.2 C) 99.1 F (37.3 C)  TempSrc:  Oral Oral Oral  SpO2:  100% 97% 96%  Weight:  196 lb 11.2 oz (89.2 kg)  195 lb 6.4 oz (88.6 kg)  Height:  6\' 1"  (1.854 m)      Intake/Output Summary (Last 24 hours) at 03/17/17 1027 Last data filed at 03/17/17 0644  Gross per 24 hour  Intake              720 ml  Output             1500 ml  Net             -780 ml   Filed Weights   03/14/17 1713 03/16/17 1047 03/17/17 0413  Weight: 202 lb 8 oz (91.9 kg) 196 lb 11.2 oz (89.2 kg) 195 lb 6.4 oz (88.6 kg)    Telemetry    NSR with PACs - Personally Reviewed  ECG    None today   Physical Exam   GEN: No acute distress.   Neck: No JVD Cardiac: RRR, no murmurs, rubs, or gallops.  Respiratory: Clear to auscultation bilaterally. GI: Soft, nontender, non-distended  MS: No edema; No deformity. Neuro:  Nonfocal  Psych: Normal affect   Labs     Chemistry Recent Labs Lab 03/14/17 1633 03/15/17 0324 03/16/17 0828 03/17/17 0258  NA 133* 136 134* 133*  K 3.9 4.3 4.2 3.7  CL 104 104 104 104  CO2 19* 23 23 21*  GLUCOSE 121* 113* 107* 106*  BUN 26* 28* 28* 22*  CREATININE 1.86* 1.89* 1.87* 1.68*  CALCIUM 8.3* 8.7* 8.5* 8.3*  PROT 6.0*  --   --   --   ALBUMIN 3.6  --   --   --   AST 74*  --   --   --   ALT 18  --   --   --   ALKPHOS 64  --   --   --   BILITOT 1.1  --   --   --   GFRNONAA 34* 33* 33* 38*  GFRAA 39* 38* 39* 44*  ANIONGAP 10 9 7 8      Hematology Recent Labs Lab 03/14/17 1514 03/14/17 1633 03/15/17 0324  WBC  --  9.5 13.3*  RBC  --  4.34 4.42  HGB 13.9 13.7 13.9  HCT 41.0 39.7  40.8  MCV  --  91.5 92.3  MCH  --  31.6 31.4  MCHC  --  34.5 34.1  RDW  --  12.9 12.9  PLT  --  139* 143*    Cardiac Enzymes Recent Labs Lab 03/14/17 1633 03/15/17 0324 03/15/17 1155 03/16/17 0214  TROPONINI 6.01* >65.00* >65.00* 53.67*   No results for input(s): TROPIPOC in the last 168 hours.   BNPNo results for input(s): BNP, PROBNP in the last 168 hours.   DDimer No results for input(s): DDIMER in the last 168 hours.   Radiology    No results found.  Cardiac Studies   Echo 03/16/17:  Study Conclusions  - Left ventricle: The cavity size was normal. Wall thickness was increased increased in a pattern of mild to moderate LVH. Systolic function was moderately to severely reduced. The estimated ejection fraction was in the range of 30% to 35%. - Regional wall motion abnormality: Akinesis of the mid-apical anterior, mid anteroseptal, mid anterolateral, apical septal, apical lateral, and apical myocardium. DIastolic function is abnormal, indeterminant grade. - Aortic valve: Valve area (VTI): 2.92 cm^2. Valve area (Vmax): 2.58 cm^2. Valve area (Vmean): 2.89 cm^2. - Atrial septum: No defect or patent foramen ovale was identified. - Pulmonary arteries: Systolic pressure was moderately  increased. PA peak pressure: 40 mm Hg (S).  LHC 03/14/17: Conclusions: 1. Anterior STEMI with diffusely diseased LAD and thrombotic subtotal occlusion of the mid vessel with TIMI one flow. 2. Occlusion of inferior segment of OM2 branch, likely subacute or chronic given to left collaterals. 3. Mild to moderate, nonobstructive disease involving the RCA. 4. Mildly elevated left ventricular filling pressure. 5. Successful IVUS guided PCI to the proximal through distal LAD with placement of 3 Promus drug-eluting stents.  Recommendations: 1. Admit to ICU for post-STEMI monitoring. 2. Obtain transthoracic echocardiogram to evaluate LVEF. 3. DAPT with aspirin and ticagrelor for at least 12 months. 4. Initiate beta-blocker as blood pressure allows. High-intensity statin therapy.  Patient Profile     76 y.o. male  with Parkinson's disease and hyperlipidemia admitted with anterior ST elevation MI. Now status post coronary stent and found to have acute systolic and diastolic heart failure with LVEF 30-35%.  Assessment & Plan    1. Anterior STEMI - Peak of troponin >65, now trending down. S/p successful IVUS guided PCI to the proximal through distal LAD with placement of 3 Promus drug-eluting stents. Remained chest pain free. Ambulating well. Continue ASA, Brillinta and statin  2. Acute systolic CHF/ICM - Echo showed LVEF is 30-35% with  akinesis of the mid to apical anterior, mid anteroseptal, mid anterolateral, apical septal, apical lateral, and apical myocardium. - Euvolemic. Continue BB. Unable to add ACE/ARB due to soft low BP, consider as outpatient.  - Also has NSVT initially, resolved.  - He is thinking about getting LifeVest through [place] due to cost.   3. HLD - 03/14/2017: Cholesterol 141; HDL 33; LDL Cholesterol 99; Triglycerides 46; VLDL 9  - Lovastatin to atorvastatin this admission. LFTs and Lipid panel in 6 weeks.   Dispo: F/u TCM appointment @ [facility]. He is ready to go home  if decided to get LifeVest through [place]., Pending insurance coverage.   Signed, Leanor Kail, PA  03/17/2017, 10:27 AM

## 2017-03-17 NOTE — Progress Notes (Signed)
CARDIAC REHAB PHASE I   PRE:  Rate/Rhythm: 76 SR    BP: sitting 137/63 right arm    SaO2: 97 RA  MODE:  Ambulation: 460 ft   POST:  Rate/Rhythm: 86 SR    BP: sitting 97/64     SaO2: 99 RA  Pt tolerated well, no c/o. Right arm shakes consistently. He is anxious to go home. He is stating he doesn't think he wants the [place]. Sts he will have to pay 20% if he gets it here and he thinks he could get it free from [place]. He did some research on-line. I set up the [place] video and gave him other videos to watch.  He has lined up care for his 83 yo mother and he will take it easy for a couple weeks. [place], ACSM 03/17/2017 10:20 AM

## 2017-03-17 NOTE — Discharge Summary (Signed)
Discharge Summary    Patient ID: Drew Sanders,  MRN: [id number], DOB/AGE: 1941/04/15 76 y.o.  Admit date: 03/14/2017 Discharge date: 03/17/2017  Primary Care Provider: Stoney Bang A Primary Cardiologist: New to Dr. Saunders Revel (likely [facility] [place] [facility])  Discharge Diagnoses    Principal Problem:   STEMI involving left anterior descending coronary artery [facility]) Active Problems:   Dyslipidemia   Parkinson's disease ([place])   S/P primary angioplasty with coronary stent   Acute combined systolic and diastolic heart failure (HCC)   NSVT (nonsustained ventricular tachycardia) ([place])   Ischemic cardiomyopathy   Allergies No Known Allergies  Diagnostic Studies/Procedures    Echo 03/16/17:  Study Conclusions  - Left ventricle: The cavity size was normal. Wall thickness was increased increased in a pattern of mild to moderate LVH. Systolic function was moderately to severely reduced. The estimated ejection fraction was in the range of 30% to 35%. - Regional wall motion abnormality: Akinesis of the mid-apical anterior, mid anteroseptal, mid anterolateral, apical septal, apical lateral, and apical myocardium. DIastolic function is abnormal, indeterminant grade. - Aortic valve: Valve area (VTI): 2.92 cm^2. Valve area (Vmax): 2.58 cm^2. Valve area (Vmean): 2.89 cm^2. - Atrial septum: No defect or patent foramen ovale was identified. - Pulmonary arteries: Systolic pressure was moderately increased. PA peak pressure: 40 mm Hg (S).  LHC 03/14/17: Conclusions: 1. Anterior STEMI with diffusely diseased LAD and thrombotic subtotal occlusion of the mid vessel with TIMI one flow. 2. Occlusion of inferior segment of OM2 branch, likely subacute or chronic given to left collaterals. 3. Mild to moderate, nonobstructive disease involving the RCA. 4. Mildly elevated left ventricular filling pressure. 5. Successful IVUS guided PCI to the proximal through  distal LAD with placement of 3 Promus drug-eluting stents.  Recommendations: 1. Admit to ICU for post-STEMI monitoring. 2. Obtain transthoracic echocardiogram to evaluate LVEF. 3. DAPT with aspirin and ticagrelor for at least 12 months. 4. Initiate beta-blocker as blood pressure allows. High-intensity statin therapy.   History of Present Illness     76 y.o.malewith Parkinson's disease and hyperlipidemia admitted with anterior ST elevation MI.  Hospital Course     Consultants: None  1. Anterior STEMI - Peak of troponin >65, now trending down. S/p successful IVUS guided PCI to the proximal through distal LAD with placement of 3 Promus drug-eluting stents. Remained chest pain free. Ambulating well. - Continue ASA, Brillinta and statin  2. Acute systolic CHF/ICM - Echo showed LVEF is 30-35% with akinesis of the mid to apical anterior, mid anteroseptal, mid anterolateral, apical septal, apical lateral, and apical myocardium. Euvolemic. Continue BB. Unable to add ACE/ARB due to soft low BP, consider as outpatient.  - Also has NSVT initially, resolved.  -  He is thinking about getting LifeVest through [place] due to cost. He initially declined. However, after long discussed he is willing to put. He will be fitted LifeVest later today prior to discharge. Advised to wear all the time. He understands risk of sudden cardiac death if non compliant.   3. HLD - 03/14/2017: Cholesterol 141; HDL 33; LDL Cholesterol 99; Triglycerides 46; VLDL 9  - Lovastatin to atorvastatin this admission. LFTs and Lipid panel in 6 weeks.   The patient has been seen by Dr. Debara Pickett today and deemed ready for discharge home. All follow-up appointments have been scheduled. Discharge medications are listed below.    Discharge Vitals Blood pressure 104/66, pulse (!) 57, temperature 97.7 F (36.5 C), temperature source  Oral, resp. rate 18, height 6\' 1"  (1.854 m), weight 195 lb 6.4 oz (88.6 kg), SpO2 96 %.  Filed Weights     03/14/17 1713 03/16/17 1047 03/17/17 0413  Weight: 202 lb 8 oz (91.9 kg) 196 lb 11.2 oz (89.2 kg) 195 lb 6.4 oz (88.6 kg)    Labs & Radiologic Studies     CBC  Recent Labs  03/14/17 1633 03/15/17 0324  WBC 9.5 13.3*  HGB 13.7 13.9  HCT 39.7 40.8  MCV 91.5 92.3  PLT 139* 161*   Basic Metabolic Panel  Recent Labs  03/16/17 0828 03/17/17 0258  NA 134* 133*  K 4.2 3.7  CL 104 104  CO2 23 21*  GLUCOSE 107* 106*  BUN 28* 22*  CREATININE 1.87* 1.68*  CALCIUM 8.5* 8.3*   Liver Function Tests  Recent Labs  03/14/17 1633  AST 74*  ALT 18  ALKPHOS 64  BILITOT 1.1  PROT 6.0*  ALBUMIN 3.6   No results for input(s): LIPASE, AMYLASE in the last 72 hours. Cardiac Enzymes  Recent Labs  03/15/17 0324 03/15/17 1155 03/16/17 0214  TROPONINI >65.00* >65.00* 53.67*   BNP Invalid input(s): POCBNP D-Dimer No results for input(s): DDIMER in the last 72 hours. Hemoglobin A1C  Recent Labs  03/14/17 1633  HGBA1C 5.2   Fasting Lipid Panel  Recent Labs  03/14/17 1633  CHOL 141  HDL 33*  LDLCALC 99  TRIG 46  CHOLHDL 4.3   Thyroid Function Tests No results for input(s): TSH, T4TOTAL, T3FREE, THYROIDAB in the last 72 hours.  Invalid input(s): FREET3  No results found.  Disposition   Pt is being discharged home today in good condition.  Follow-up Plans & Appointments    Follow-up Information    Lendon Colonel, NP. Go on 03/26/2017.   Specialties:  Nurse Practitioner, Radiology, Cardiology Why:  @1pm  for TCM Contact information: [address] [place] [address] [phone]          Discharge Instructions    Amb Referral to Cardiac Rehabilitation    Complete by:  As directed    Diagnosis:  STEMI   Diet - low sodium heart healthy    Complete by:  As directed    Discharge instructions    Complete by:  As directed    No driving for 2 weeks. No lifting over 10 lbs for 4 weeks. No sexual activity for 4 weeks. You may not return to work  until cleared by your cardiologist. Keep procedure site clean & dry. If you notice increased pain, swelling, bleeding or pus, call/return!  You may shower, but no soaking baths/hot tubs/pools for 1 week.   Increase activity slowly    Complete by:  As directed       Discharge Medications   Current Discharge Medication List    START taking these medications   Details  acetaminophen (TYLENOL) 325 MG tablet Take 2 tablets (650 mg total) by mouth every 4 (four) hours as needed for headache or mild pain.    aspirin 81 MG chewable tablet Chew 1 tablet (81 mg total) by mouth daily.    atorvastatin (LIPITOR) 80 MG tablet Take 1 tablet (80 mg total) by mouth daily at 6 PM. Qty: 90 tablet, Refills: 3    metoprolol succinate (TOPROL-XL) 25 MG 24 hr tablet Take 1 tablet (25 mg total) by mouth daily. Qty: 90 tablet, Refills: 3    nitroGLYCERIN (NITROSTAT) 0.4 MG SL tablet Place 1 tablet (0.4 mg total)  under the tongue every 5 (five) minutes as needed for chest pain. Qty: 25 tablet, Refills: 2    ticagrelor (BRILINTA) 90 MG TABS tablet Take 1 tablet (90 mg total) by mouth 2 (two) times daily. Qty: 180 tablet, Refills: 3      CONTINUE these medications which have NOT CHANGED   Details  carbidopa-levodopa (SINEMET IR) 25-100 MG tablet Take 1 tablet by mouth 3 (three) times daily after meals.    Multiple Vitamin (MULTIVITAMIN WITH MINERALS) TABS tablet Take 1 tablet by mouth daily.    Omega-3 Fatty Acids (FISH OIL PO) Take 1 capsule by mouth at bedtime.      STOP taking these medications     ibuprofen (ADVIL,MOTRIN) 200 MG tablet      lovastatin (MEVACOR) 10 MG tablet          Aspirin prescribed at discharge?  Yes High Intensity Statin Prescribed? (Lipitor 40-80mg  or Crestor 20-40mg ): Yes Beta Blocker Prescribed? Yes For EF 45% or less, Was ACEI/ARB Prescribed? Unable to add due to soft low BP ADP Receptor Inhibitor Prescribed? (i.e. Plavix etc.-Includes Medically Managed Patients):  Yes For EF <40%, Aldosterone Inhibitor Prescribed? Soft BP Was EF assessed during THIS hospitalization? Yes Was Cardiac Rehab II ordered? (Included Medically managed Patients): Yes   Outstanding Labs/Studies   LFT and Lipid panel in 6 weeks.   Duration of Discharge Encounter   Greater than 30 minutes including physician time.  Signed, Taiven Greenley PA-C 03/17/2017, 3:51 PM

## 2017-03-17 NOTE — Progress Notes (Signed)
Received call from Clair Gulling with [facility], patient has been approved for a Life Vest; Clair Gulling to talk to the patient via telephone; Aneta Mins [phone]

## 2017-03-17 NOTE — Progress Notes (Signed)
Late Entry: Orders received for pt discharge.  Discharge summary printed and reviewed with pt.  Explained medication regimen, and pt had no further questions at this time.  IV removed and site remains clean, dry, intact.  Telemetry removed.  Explained the importance of not missing any Brilinta doses.  Gave pt hard copy of Brilinta Rx + Brilinta 90-day coupon.  Pt in stable condition and awaiting transport.

## 2017-03-18 ENCOUNTER — Telehealth: Payer: Self-pay | Admitting: *Deleted

## 2017-03-18 NOTE — Telephone Encounter (Signed)
Patient contacted regarding discharge from [facility] on 03/17/17.  Patient understands to follow up with provider Ermalinda Barrios, PA-C on 03/26/17 at 1:00 at [facility]. Patient understands discharge instructions? yes Patient understands medications and regiment? yes Patient understands to bring all medications to this visit? yes  All  Medications questions answered. Patient reports taking Simvastatin 20 mg daily that do not belong to him. Patient informed that he needs to take the medicine prescribed to him. Patient then asked if he may take Lovastatin 10 mg. Patient stated that he would get medication from the [place]. When asked if patient needed assistance with medication pt stated that he would get it from [place] until he went to the [place].

## 2017-03-19 ENCOUNTER — Telehealth: Payer: Self-pay | Admitting: Cardiovascular Disease

## 2017-03-19 ENCOUNTER — Telehealth: Payer: Self-pay

## 2017-03-19 NOTE — Telephone Encounter (Signed)
Pt d/c from hospital on 03/17/17 Dialed number which goes to [facility] imaging services. Unable to reach caller or make use of automated prompts to speak to individual who can connect me.

## 2017-03-19 NOTE — Telephone Encounter (Signed)
Called pt. No answer. Left message for pt to return call.  

## 2017-03-19 NOTE — Telephone Encounter (Signed)
She needs to clarify the date that the pt was discharged from the hospital.

## 2017-03-19 NOTE — Telephone Encounter (Signed)
-----   Message from Orinda Kenner sent at 03/17/2017  3:46 PM EDT ----- Regarding: TCM Patient being d/c'd 03/17/17..  Thanks, Terru

## 2017-03-26 ENCOUNTER — Ambulatory Visit (INDEPENDENT_AMBULATORY_CARE_PROVIDER_SITE_OTHER): Payer: Medicare PPO | Admitting: Physician Assistant

## 2017-03-26 ENCOUNTER — Encounter: Payer: Self-pay | Admitting: Physician Assistant

## 2017-03-26 VITALS — BP 110/70 | HR 54 | Ht 73.0 in | Wt 198.0 lb

## 2017-03-26 DIAGNOSIS — I4729 Other ventricular tachycardia: Secondary | ICD-10-CM

## 2017-03-26 DIAGNOSIS — I472 Ventricular tachycardia: Secondary | ICD-10-CM | POA: Diagnosis not present

## 2017-03-26 DIAGNOSIS — E785 Hyperlipidemia, unspecified: Secondary | ICD-10-CM

## 2017-03-26 DIAGNOSIS — I255 Ischemic cardiomyopathy: Secondary | ICD-10-CM | POA: Diagnosis not present

## 2017-03-26 DIAGNOSIS — I2102 ST elevation (STEMI) myocardial infarction involving left anterior descending coronary artery: Secondary | ICD-10-CM | POA: Diagnosis not present

## 2017-03-26 NOTE — Patient Instructions (Signed)
Your physician recommends that you schedule a follow-up appointment in [place] has requested that you have an echocardiogram. Echocardiography is a painless test that uses sound waves to create images of your heart. It provides your doctor with information about the size and shape of your heart and how well your heart's chambers and valves are working. This procedure takes approximately one hour. There are no restrictions for this procedure.  Your physician recommends that you return for lab work Fasting ( Nothing to eat or drink for 8 to 12 hours prior)   If you need a refill on your cardiac medications before your next appointment, please call your pharmacy.  Thank you for choosing [facility]!

## 2017-03-26 NOTE — Progress Notes (Signed)
Cardiology Office Note    Date:  03/26/2017   ID:  Drew Sanders, DOB February 16, 1941, MRN [id number]  PCP:  Neale Burly, MD  Cardiologist: New to [facility] was seen by Dr. Saunders Revel  Chief Complaint  Patient presents with  . Follow-up    History of Present Illness:  Drew Sanders is a 76 y.o. male with history of Parkinson's and hyperlipidemia was admitted with an anterior STEMI status post successful IVUS guided PCI to the proximal through distal LAD with 3 drug-eluting stents.Has occlusion of an inferior segment of OM 2 branch likely subacute or chronic given left collaterals, mild to moderate nonobstructive disease in the RCA. Peak troponin greater than 65. Patient also had acute systolic CHF LVEF 00-86% on echo. Unable to add ACE inhibitor or ARB due to soft low blood pressures. Initially had NSVT which resolved. LifeVest was placed on patient prior to discharge. He was started on atorvastatin 80 mg daily.  Patient comes in today for follow-up accompanied by his wife. He denies any chest pain, palpitations, dyspnea, dyspnea on exertion, dizziness or presyncope. He has not gotten his stamina back. He doesn't want do cardiac rehabilitation because he was very active prior to his MI. He is walking 15 minutes daily and up an incline without difficulty. He has not received his atorvastatin from the [place] at and has been taking lovastatin 20 mg.  Past Medical History:  Diagnosis Date  . HLD (hyperlipidemia)   . Parkinson disease ([place])   . Prostate cancer [facility])     Past Surgical History:  Procedure Laterality Date  . CARDIAC CATHETERIZATION     normal  . CATARACT EXTRACTION W/PHACO Left 04/06/2015   Procedure: CATARACT EXTRACTION PHACO AND INTRAOCULAR LENS PLACEMENT :  CDE:  9.79;  Surgeon: Tonny Branch, MD;  Location: [facility];  Service: Ophthalmology;  Laterality: Left;  . CATARACT EXTRACTION W/PHACO Right 04/24/2015   Procedure: CATARACT EXTRACTION PHACO AND INTRAOCULAR LENS PLACEMENT ([place]);   Surgeon: Tonny Branch, MD;  Location: [facility];  Service: Ophthalmology;  Laterality: Right;  CDE: 12.67  . COLONOSCOPY    . CORONARY STENT INTERVENTION N/A 03/14/2017   Procedure: Coronary Stent Intervention;  Surgeon: Nelva Bush, MD;  Location: [place] CV LAB;  Service: Cardiovascular;  Laterality: N/A;  . HERNIA REPAIR    . INTRAVASCULAR ULTRASOUND/IVUS N/A 03/14/2017   Procedure: Intravascular Ultrasound/IVUS;  Surgeon: Nelva Bush, MD;  Location: [place] CV LAB;  Service: Cardiovascular;  Laterality: N/A;  . LAPAROSCOPIC RETROPUBIC PROSTATECTOMY    . LEFT HEART CATH AND CORONARY ANGIOGRAPHY N/A 03/14/2017   Procedure: Left Heart Cath and Coronary Angiography;  Surgeon: Nelva Bush, MD;  Location: [place] CV LAB;  Service: Cardiovascular;  Laterality: N/A;    Current Medications: Outpatient Medications Prior to Visit  Medication Sig Dispense Refill  . acetaminophen (TYLENOL) 325 MG tablet Take 2 tablets (650 mg total) by mouth every 4 (four) hours as needed for headache or mild pain.    Marland Kitchen aspirin 81 MG chewable tablet Chew 1 tablet (81 mg total) by mouth daily.    . carbidopa-levodopa (SINEMET IR) 25-100 MG tablet Take 1 tablet by mouth 3 (three) times daily after meals.    . metoprolol succinate (TOPROL-XL) 25 MG 24 hr tablet Take 1 tablet (25 mg total) by mouth daily. 90 tablet 3  . Multiple Vitamin (MULTIVITAMIN WITH MINERALS) TABS tablet Take 1 tablet by mouth daily.    . nitroGLYCERIN (NITROSTAT) 0.4 MG SL tablet Place 1 tablet (0.4  mg total) under the tongue every 5 (five) minutes as needed for chest pain. 25 tablet 2  . Omega-3 Fatty Acids (FISH OIL PO) Take 1 capsule by mouth at bedtime.    . ticagrelor (BRILINTA) 90 MG TABS tablet Take 1 tablet (90 mg total) by mouth 2 (two) times daily. 180 tablet 3  . atorvastatin (LIPITOR) 80 MG tablet Take 1 tablet (80 mg total) by mouth daily at 6 PM. (Patient not taking: Reported on 03/26/2017) 90 tablet 3   No  facility-administered medications prior to visit.      Allergies:   Patient has no known allergies.   Social History   Social History  . Marital status: Divorced    Spouse name: N/A  . Number of children: N/A  . Years of education: N/A   Social History Main Topics  . Smoking status: Never Smoker  . Smokeless tobacco: Never Used  . Alcohol use Yes     Comment: occ  . Drug use: No  . Sexual activity: Yes   Other Topics Concern  . None   Social History Narrative  . None     Family History:  The patient's   family history includes Heart disease in his father.   ROS:   Please see the history of present illness.    Review of Systems  Constitution: Positive for weakness and malaise/fatigue.  HENT: Negative.   Cardiovascular: Negative.   Respiratory: Negative.   Endocrine: Negative.   Hematologic/Lymphatic: Negative.   Musculoskeletal: Negative.   Gastrointestinal: Negative.   Genitourinary: Negative.    All other systems reviewed and are negative.   PHYSICAL EXAM:   VS:  BP 110/70   Pulse (!) 54   Ht 6\' 1"  (1.854 m)   Wt 198 lb (89.8 kg)   SpO2 98%   BMI 26.12 kg/m   Physical Exam  GEN: Well nourished, well developed, in no acute distress  Neck: no JVD, carotid bruits, or masses Cardiac:RRR; no murmurs, rubs, or gallops  Respiratory:  clear to auscultation bilaterally, normal work of breathing GI: soft, nontender, nondistended, + BS Ext: Right arm at cath site without hematoma or hemorrhage, other wise lower extremities without cyanosis, clubbing, or edema, Good distal pulses bilaterally Neuro: Tremor from Parkinson's Psych: euthymic mood, full affect  Wt Readings from Last 3 Encounters:  03/26/17 198 lb (89.8 kg)  03/17/17 195 lb 6.4 oz (88.6 kg)  04/24/15 208 lb (94.3 kg)      Studies/Labs Reviewed:   EKG:  EKG is not ordered today.    Recent Labs: 03/14/2017: ALT 18 03/15/2017: Hemoglobin 13.9; Platelets 143 03/17/2017: BUN 22; Creatinine, Ser 1.68;  Potassium 3.7; Sodium 133   Lipid Panel    Component Value Date/Time   CHOL 141 03/14/2017 1633   TRIG 46 03/14/2017 1633   HDL 33 (L) 03/14/2017 1633   CHOLHDL 4.3 03/14/2017 1633   VLDL 9 03/14/2017 1633   LDLCALC 99 03/14/2017 1633    Additional studies/ records that were reviewed today include:   Echo 03/16/17:  Study Conclusions   - Left ventricle: The cavity size was normal. Wall thickness was   increased increased in a pattern of mild to moderate LVH.   Systolic function was moderately to severely reduced. The   estimated ejection fraction was in the range of 30% to 35%. - Regional wall motion abnormality: Akinesis of the mid-apical   anterior, mid anteroseptal, mid anterolateral, apical septal,   apical lateral, and apical myocardium. DIastolic function  is   abnormal, indeterminant grade. - Aortic valve: Valve area (VTI): 2.92 cm^2. Valve area (Vmax):   2.58 cm^2. Valve area (Vmean): 2.89 cm^2. - Atrial septum: No defect or patent foramen ovale was identified. - Pulmonary arteries: Systolic pressure was moderately increased.   PA peak pressure: 40 mm Hg (S).   LHC 03/14/17: Conclusions: 1. Anterior STEMI with diffusely diseased LAD and thrombotic subtotal occlusion of the mid vessel with TIMI one flow. 2. Occlusion of inferior segment of OM2 branch, likely subacute or chronic given to left collaterals. 3. Mild to moderate, nonobstructive disease involving the RCA. 4. Mildly elevated left ventricular filling pressure. 5. Successful IVUS guided PCI to the proximal through distal LAD with placement of 3 Promus drug-eluting stents.   Recommendations: 1. Admit to ICU for post-STEMI monitoring. 2. Obtain transthoracic echocardiogram to evaluate LVEF. 3. DAPT with aspirin and ticagrelor for at least 12 months. 4. Initiate beta-blocker as blood pressure allows. High-intensity statin therapy.      ASSESSMENT:    1. STEMI involving left anterior descending coronary  artery ([place])   2. Ischemic cardiomyopathy   3. NSVT (nonsustained ventricular tachycardia) ([place])   4. Dyslipidemia      PLAN:  In order of problems listed above:  Status post anterior STEMI treated with 3 overlapping DES to the LAD with residual mild to moderate nonobstructive disease in the RCA and occlusion inferior segment of OM 2. Doing well without angina. Exercising 15 minutes daily. Does not want to do cardiac rehabilitation. Continue aspirin and Brilinta and metoprolol. Follow-up with cardiologist in the [place] office in 2 months.  Ischemic cardiomyopathy ejection fraction 30-35% on echo. Patient is wearing a life vest. Will order follow-up echo in 2 months. We'll not start ACE inhibitor/ARB is of CKD with creatinine of 1.8 in the hospital. Blood pressures 026 systolic today. No evidence of heart failure. 2 g sodium diet given.  NSVT resolved LifeVest has not gone off.  Dyslipidemia hasn't started Lipitor yet. Expects to get it from the [place] today or tomorrow. We'll give him a prescription to fill the pharmacy if he does not receive it soon. will need follow-up lipids and LFTs in 6 weeks.    Medication Adjustments/Labs and Tests Ordered: Current medicines are reviewed at length with the patient today.  Concerns regarding medicines are outlined above.  Medication changes, Labs and Tests ordered today are listed in the Patient Instructions below. Patient Instructions  Your physician recommends that you schedule a follow-up appointment in [place] has requested that you have an echocardiogram. Echocardiography is a painless test that uses sound waves to create images of your heart. It provides your doctor with information about the size and shape of your heart and how well your heart's chambers and valves are working. This procedure takes approximately one hour. There are no restrictions for this procedure.  Your physician recommends that you return for lab work Fasting (  Nothing to eat or drink for 8 to 12 hours prior)   If you need a refill on your cardiac medications before your next appointment, please call your pharmacy.  Thank you for choosing [facility]!      Signed, Ermalinda Barrios, PA-C  03/26/2017 1:37 PM    [facility] [address], [place], [place]  [address] Phone: [phone]; Fax: [phone]

## 2017-04-01 ENCOUNTER — Telehealth: Payer: Self-pay | Admitting: Cardiology

## 2017-04-01 MED ORDER — CLOPIDOGREL BISULFATE 75 MG PO TABS: 75.0000 mg | ORAL_TABLET | Freq: Every day | ORAL | 3 refills | Status: AC

## 2017-04-01 NOTE — Telephone Encounter (Signed)
Pt called to let us know know that the [facility] has switched him from Brilinta 90 mg BID  to Plavix 75 mg daily. He wanted to let Dr. Gwyndolyn Kaufman.

## 2017-04-01 NOTE — Telephone Encounter (Signed)
Noted. It does not appear that I have seen this patient. He has been seen by Ms. Bonnell Public PA-C and apparently has follow-up scheduled in the [facility] office.

## 2017-04-01 NOTE — Telephone Encounter (Signed)
Please give pt a call concerning his Brilinta, he has some questions

## 2017-04-03 ENCOUNTER — Telehealth: Payer: Self-pay

## 2017-04-03 NOTE — Telephone Encounter (Signed)
-----   Message from Satira Sark, MD sent at 04/03/2017  2:14 PM EDT ----- I have not seen this patient. I reviewed the chart. He had a recent STEMI with DES interventions and therefore cannot stop Plavix at this time. Generally, he would not have to do this anyway for a teeth cleaning. He does not need SBE prophylaxis. Please make sure that this is documented in the chart rather than a message.  ----- Message ----- From: Acquanetta Chain, LPN Sent: 9/72/8206   1:43 PM To: Satira Sark, MD  Does not look like you have seen this patient in office, but has follow up apt with you. [facility] called stating patient was in office to get teeth cleaned and did not know if patient needed to have any meds before hand or if any changes needed to be made with the plavix.  Thank you

## 2017-04-03 NOTE — Telephone Encounter (Signed)
Dentist office made aware of Dr. Chuck Hint recommendation in below conversation.

## 2017-04-15 ENCOUNTER — Telehealth: Payer: Self-pay | Admitting: *Deleted

## 2017-04-15 NOTE — Telephone Encounter (Signed)
Received a PA from [place] regarding life vest placed on patient. Faxed information to Arcola Jansky RN who requested info be forwarded to her

## 2017-04-17 DIAGNOSIS — I213 ST elevation (STEMI) myocardial infarction of unspecified site: Secondary | ICD-10-CM | POA: Diagnosis not present

## 2017-05-02 ENCOUNTER — Telehealth: Payer: Self-pay | Admitting: Physician Assistant

## 2017-05-02 NOTE — Telephone Encounter (Signed)
Patient states that he does not want to wear life vest anymore due to it being too expensive / tg

## 2017-05-02 NOTE — Telephone Encounter (Signed)
Patient asks to speak with Sharyn Lull, will forward his call to her

## 2017-05-05 NOTE — Telephone Encounter (Signed)
Spoke with patient. He says he can't afford life vest and is sending it back today. I told him he is at risk of sudden cardiac death with his heart muscle damage. I also asked his to contact the [facility] to see if they will help cover the cost. He says he understand the risk but is still sending the life vest back.   Tye Maryland, can you see if they can do an echo in the [place] office on him this week(not Wed)? He's scheduled for July 5th but I'd like it done sooner if possible. Thanks, Ermalinda Barrios

## 2017-05-05 NOTE — Telephone Encounter (Signed)
No available echo apts in [place] per terry, she will schedule at [facility]

## 2017-05-09 ENCOUNTER — Ambulatory Visit (HOSPITAL_COMMUNITY)
Admission: RE | Admit: 2017-05-09 | Discharge: 2017-05-09 | Disposition: A | Discharge status: Home / Self Care | Payer: Medicare PPO | Source: Ambulatory Visit | Attending: Physician Assistant | Admitting: Physician Assistant

## 2017-05-09 DIAGNOSIS — R29898 Other symptoms and signs involving the musculoskeletal system: Secondary | ICD-10-CM | POA: Insufficient documentation

## 2017-05-09 DIAGNOSIS — I255 Ischemic cardiomyopathy: Secondary | ICD-10-CM | POA: Diagnosis not present

## 2017-05-09 DIAGNOSIS — I340 Nonrheumatic mitral (valve) insufficiency: Secondary | ICD-10-CM | POA: Diagnosis not present

## 2017-05-09 LAB — ECHOCARDIOGRAM COMPLETE
AVPHT: 822 ms
CHL CUP DOP CALC LVOT VTI: 25.5 cm
CHL CUP MV DEC (S): 261
CHL CUP TV REG PEAK VELOCITY: 254 cm/s
E/e' ratio: 8.55
EWDT: 261 ms
FS: 37 % (ref 28–44)
IVS/LV PW RATIO, ED: 1.06
LA ID, A-P, ES: 28 mm
LA diam end sys: 28 mm
LA vol: 61.4 mL
LADIAMINDEX: 1.3 cm/m2
LAVOLA4C: 58.4 mL
LAVOLIN: 28.4 mL/m2
LV E/e' medial: 8.55
LV E/e'average: 8.55
LV PW d: 12.5 mm — AB (ref 0.6–1.1)
LV SIMPSON'S DISK: 57
LV TDI E'LATERAL: 8.27
LV TDI E'MEDIAL: 5.33
LV dias vol index: 43 mL/m2
LV dias vol: 93 mL (ref 62–150)
LV sys vol: 40 mL (ref 21–61)
LVELAT: 8.27 cm/s
LVOT SV: 88 mL
LVOT area: 3.46 cm2
LVOT diameter: 21 mm
LVOT peak grad rest: 4 mmHg
LVOTPV: 105 cm/s
LVSYSVOLIN: 18 mL/m2
Lateral S' vel: 10.4 cm/s
MVPKAVEL: 71 m/s
MVPKEVEL: 70.7 m/s
RV sys press: 34 mmHg
Stroke v: 53 ml
TAPSE: 23.9 mm
TRMAXVEL: 254 cm/s

## 2017-05-09 NOTE — Progress Notes (Signed)
*  PRELIMINARY RESULTS* Echocardiogram 2D Echocardiogram has been performed.  Drew Sanders 05/09/2017, 12:51 PM

## 2017-05-15 ENCOUNTER — Other Ambulatory Visit: Payer: Medicare PPO

## 2017-06-02 ENCOUNTER — Encounter: Payer: Self-pay | Admitting: Cardiology

## 2017-06-02 ENCOUNTER — Ambulatory Visit (INDEPENDENT_AMBULATORY_CARE_PROVIDER_SITE_OTHER): Payer: Medicare PPO | Admitting: Cardiology

## 2017-06-02 VITALS — BP 117/74 | HR 46 | Ht 73.0 in | Wt 188.2 lb

## 2017-06-02 DIAGNOSIS — E782 Mixed hyperlipidemia: Secondary | ICD-10-CM

## 2017-06-02 DIAGNOSIS — R001 Bradycardia, unspecified: Secondary | ICD-10-CM | POA: Diagnosis not present

## 2017-06-02 DIAGNOSIS — I255 Ischemic cardiomyopathy: Secondary | ICD-10-CM

## 2017-06-02 DIAGNOSIS — I25119 Atherosclerotic heart disease of native coronary artery with unspecified angina pectoris: Secondary | ICD-10-CM

## 2017-06-02 MED ORDER — METOPROLOL SUCCINATE ER 25 MG PO TB24: 12.5000 mg | ORAL_TABLET | Freq: Every day | ORAL | 3 refills | Status: DC

## 2017-06-02 NOTE — Patient Instructions (Signed)
Medication Instructions:  Your physician has recommended you make the following change in your medication:   Begin taking Toprol XL 12.5 mg daily  Please continue all other medications as prescribed  Labwork: NONE  Testing/Procedures: NONE  Follow-Up: Your physician wants you to follow-up in: [place] DR. Domenic Polite You will receive a reminder letter in the mail two months in advance. If you don't receive a letter, please call our office to schedule the follow-up appointment.  Any Other Special Instructions Will Be Listed Below (If Applicable).  If you need a refill on your cardiac medications before your next appointment, please call your pharmacy.

## 2017-06-02 NOTE — Progress Notes (Signed)
Cardiology Office Note  Date: 06/02/2017   ID: Quavion Boule, DOB [birth year], MRN [id number]  PCP: Neale Burly, MD  Evaluating Cardiologist: Rozann Lesches, MD   Chief Complaint  Patient presents with  . Coronary Artery Disease    History of Present Illness: Drew Sanders is a 76 y.o. male last seen in the office back in May by Ms. Bonnell Public PA-C. This is my first meeting with him. I reviewed records and updated the chart. He presents for a routine follow-up visit. At this point he reports doing reasonably well, walks for 30 minutes at a time nearly every morning. He does not report any angina or nitroglycerin use.  Follow-up echocardiogram done in June showed LVEF 50% with mid to apical anteroseptal akinesis and mild diastolic dysfunction. This represents an improvement compared to May at which time LVEF was 30-35%.  We went over his medications. He does report some fatigue, heart rate in the 40s to 50s based on last few visits. We discussed reducing his Toprol-XL dose.  He follows for primary care through the [facility] hospital system and also with Dr. Sherrie Sport.  Past Medical History:  Diagnosis Date  . Acute ST elevation myocardial infarction (STEMI) of anterior wall [facility])    May 2018  . CAD (coronary artery disease)    DES 3 to LAD May 2018  . Hyperlipidemia   . Parkinson disease ([place])   . Prostate cancer [facility])     Past Surgical History:  Procedure Laterality Date  . CARDIAC CATHETERIZATION     normal  . CATARACT EXTRACTION W/PHACO Left 04/06/2015   Procedure: CATARACT EXTRACTION PHACO AND INTRAOCULAR LENS PLACEMENT :  CDE:  9.79;  Surgeon: Tonny Branch, MD;  Location: [facility];  Service: Ophthalmology;  Laterality: Left;  . CATARACT EXTRACTION W/PHACO Right 04/24/2015   Procedure: CATARACT EXTRACTION PHACO AND INTRAOCULAR LENS PLACEMENT ([place]);  Surgeon: Tonny Branch, MD;  Location: [facility];  Service: Ophthalmology;  Laterality: Right;  CDE: 12.67  . COLONOSCOPY    . CORONARY STENT  INTERVENTION N/A 03/14/2017   Procedure: Coronary Stent Intervention;  Surgeon: Nelva Bush, MD;  Location: [place] CV LAB;  Service: Cardiovascular;  Laterality: N/A;  . HERNIA REPAIR    . INTRAVASCULAR ULTRASOUND/IVUS N/A 03/14/2017   Procedure: Intravascular Ultrasound/IVUS;  Surgeon: Nelva Bush, MD;  Location: [place] CV LAB;  Service: Cardiovascular;  Laterality: N/A;  . LAPAROSCOPIC RETROPUBIC PROSTATECTOMY    . LEFT HEART CATH AND CORONARY ANGIOGRAPHY N/A 03/14/2017   Procedure: Left Heart Cath and Coronary Angiography;  Surgeon: Nelva Bush, MD;  Location: [place] CV LAB;  Service: Cardiovascular;  Laterality: N/A;    Current Outpatient Prescriptions  Medication Sig Dispense Refill  . acetaminophen (TYLENOL) 325 MG tablet Take 2 tablets (650 mg total) by mouth every 4 (four) hours as needed for headache or mild pain.    Marland Kitchen aspirin 81 MG chewable tablet Chew 1 tablet (81 mg total) by mouth daily.    Marland Kitchen atorvastatin (LIPITOR) 80 MG tablet Take 1 tablet (80 mg total) by mouth daily at 6 PM. 90 tablet 3  . carbidopa-levodopa (SINEMET IR) 25-100 MG tablet Take 1 tablet by mouth 3 (three) times daily after meals.    . clopidogrel (PLAVIX) 75 MG tablet Take 1 tablet (75 mg total) by mouth daily. 90 tablet 3  . Multiple Vitamin (MULTIVITAMIN WITH MINERALS) TABS tablet Take 1 tablet by mouth daily.    . nitroGLYCERIN (NITROSTAT) 0.4 MG SL tablet Place 1 tablet (  0.4 mg total) under the tongue every 5 (five) minutes as needed for chest pain. 25 tablet 2  . Omega-3 Fatty Acids (FISH OIL PO) Take 1 capsule by mouth at bedtime.    . metoprolol succinate (TOPROL XL) 25 MG 24 hr tablet Take 0.5 tablets (12.5 mg total) by mouth daily. 45 tablet 3   No current facility-administered medications for this visit.    Allergies:  Patient has no known allergies.   Social History: The patient  reports that he has never smoked. He has never used smokeless tobacco. He reports that he drinks  alcohol. He reports that he does not use drugs.   ROS:  Please see the history of present illness. Otherwise, complete review of systems is positive for Parkinson's tremors.  All other systems are reviewed and negative.   Physical Exam: VS:  BP 117/74   Pulse (!) 46   Ht 6\' 1"  (1.854 m)   Wt 188 lb 3.2 oz (85.4 kg)   BMI 24.83 kg/m , BMI Body mass index is 24.83 kg/m.  Wt Readings from Last 3 Encounters:  06/02/17 188 lb 3.2 oz (85.4 kg)  03/26/17 198 lb (89.8 kg)  03/17/17 195 lb 6.4 oz (88.6 kg)    General: Elderly male, appears comfortable at rest. HEENT: Conjunctiva and lids normal, oropharynx clear. Neck: Supple, no elevated JVP or carotid bruits, no thyromegaly. Lungs: Clear to auscultation, nonlabored breathing at rest. Cardiac: Regular rate and rhythm, soft significant systolic murmur, no pericardial rub. Abdomen: Soft, nontender, bowel sounds present, no guarding or rebound. Extremities: No pitting edema, distal pulses 2+. Skin: Warm and dry. Musculoskeletal: No kyphosis. Neuropsychiatric: Alert and oriented x3, Parkinsonian facies with tremor.  ECG: I personally reviewed the tracing from  Recent Labwork: 03/14/2017: ALT 18; AST 74 03/15/2017: Hemoglobin 13.9; Platelets 143 03/17/2017: BUN 22; Creatinine, Ser 1.68; Potassium 3.7; Sodium 133     Component Value Date/Time   CHOL 141 03/14/2017 1633   TRIG 46 03/14/2017 1633   HDL 33 (L) 03/14/2017 1633   CHOLHDL 4.3 03/14/2017 1633   VLDL 9 03/14/2017 1633   LDLCALC 99 03/14/2017 1633    Other Studies Reviewed Today:  Cardiac catheterization and PCI 03/14/2017: 1. Anterior STEMI with diffusely diseased LAD and thrombotic subtotal occlusion of the mid vessel with TIMI one flow. 2. Occlusion of inferior segment of OM2 branch, likely subacute or chronic given to left collaterals. 3. Mild to moderate, nonobstructive disease involving the RCA. 4. Mildly elevated left ventricular filling pressure. 5. Successful IVUS  guided PCI to the proximal through distal LAD with placement of 3 Promus drug-eluting stents.  Echocardiogram 05/09/2017: Study Conclusions  - Left ventricle: The cavity size was normal. Wall thickness was   increased in a pattern of mild LVH. The estimated ejection   fraction was 50%. There is akinesis of the mid-apicalanteroseptal   myocardium. Doppler parameters are consistent with abnormal left   ventricular relaxation (grade 1 diastolic dysfunction). - Aortic valve: There was trivial regurgitation. - Mitral valve: Mildly thickened leaflets . There was mild   regurgitation. - Right atrium: Central venous pressure (est): 8 mm Hg. - Tricuspid valve: There was trivial regurgitation. - Pulmonary arteries: PA peak pressure: 34 mm Hg (S). - Pericardium, extracardiac: There was no pericardial effusion.  Impressions:  - Mild LVH with LVEF approximately 50%. There is mid to apical   anteroseptal akinesis. Grade 1 diastolic dysfunction. Mildly   thickened mitral valve with mild mitral regurgitation. Trivial   aortic regurgitation.  Trivial tricuspid regurgitation with PASP   estimated 34 mmHg.  Assessment and Plan:  1. History of anterior STEMI in May of this year managed with placement of DES 3 to the LAD. He is clinically stable at this point and ontinues on medical therapy including DAPT.  2. Ischemic cardiomyopathy with improvement in LVEF to 50% based on follow-up echocardiogram.  3. Bradycardia, some reported fatigue. Reduce Toprol-XL to 12.5 mg daily.  4. Hyperlipidemia, continues on Lipitor.  Current medicines were reviewed with the patient today.  Disposition: Follow-up in 6 months.  Signed, Satira Sark, MD, [facility] 06/02/2017 1:59 PM    [facility] [place], [place], [place] [address] Phone: [phone]; Fax: [phone]

## 2017-06-26 DIAGNOSIS — I222 Subsequent non-ST elevation (NSTEMI) myocardial infarction: Secondary | ICD-10-CM | POA: Diagnosis not present

## 2017-06-26 DIAGNOSIS — Z6824 Body mass index (BMI) 24.0-24.9, adult: Secondary | ICD-10-CM | POA: Diagnosis not present

## 2017-06-26 DIAGNOSIS — M545 Low back pain: Secondary | ICD-10-CM | POA: Diagnosis not present

## 2017-06-26 DIAGNOSIS — G20 Parkinson's disease: Secondary | ICD-10-CM | POA: Diagnosis not present

## 2017-06-26 DIAGNOSIS — E784 Other hyperlipidemia: Secondary | ICD-10-CM | POA: Diagnosis not present

## 2017-10-24 DIAGNOSIS — G20 Parkinson's disease: Secondary | ICD-10-CM | POA: Diagnosis not present

## 2017-10-24 DIAGNOSIS — I2583 Coronary atherosclerosis due to lipid rich plaque: Secondary | ICD-10-CM | POA: Diagnosis not present

## 2017-10-24 DIAGNOSIS — Z6824 Body mass index (BMI) 24.0-24.9, adult: Secondary | ICD-10-CM | POA: Diagnosis not present

## 2017-10-24 DIAGNOSIS — E7849 Other hyperlipidemia: Secondary | ICD-10-CM | POA: Diagnosis not present

## 2017-10-24 DIAGNOSIS — G250 Essential tremor: Secondary | ICD-10-CM | POA: Diagnosis not present

## 2017-10-24 DIAGNOSIS — M545 Low back pain: Secondary | ICD-10-CM | POA: Diagnosis not present

## 2017-11-28 NOTE — Progress Notes (Signed)
Cardiology Office Note  Date: 12/01/2017   ID: Drew Sanders, DOB 1941/06/17, MRN [id number]  PCP: Neale Burly, MD  Primary Cardiologist: Rozann Lesches, MD   Chief Complaint  Patient presents with  . Coronary Artery Disease    History of Present Illness: Drew Sanders is a 77 y.o. male last seen in July 2018. He presents for a routine follow-up visit. Reports no angina or nitroglycerin use. Has been doing some walking for exercise when the weather is not too cold.  However his medications. He decided to stop Lipitor. He was concerned that this was giving him arthritic pains and back pain. He was on Lipitor 80 mg daily, we discussed using a lower dose cc tolerated better, but he did not want to make any medication changes as this time.  We went over the results of his cardiac catheterization, he also had an occluded OM2 branch that is associated with collaterals. This is to be managed medically.  He continues on dual antiplatelet therapy, no bleeding problems.  Past Medical History:  Diagnosis Date  . Acute ST elevation myocardial infarction (STEMI) of anterior wall [facility])    May 2018  . CAD (coronary artery disease)    DES 3 to LAD May 2018  . Hyperlipidemia   . Parkinson disease ([place])   . Prostate cancer [facility])     Past Surgical History:  Procedure Laterality Date  . CARDIAC CATHETERIZATION     normal  . CATARACT EXTRACTION W/PHACO Left 04/06/2015   Procedure: CATARACT EXTRACTION PHACO AND INTRAOCULAR LENS PLACEMENT :  CDE:  9.79;  Surgeon: Tonny Branch, MD;  Location: [facility];  Service: Ophthalmology;  Laterality: Left;  . CATARACT EXTRACTION W/PHACO Right 04/24/2015   Procedure: CATARACT EXTRACTION PHACO AND INTRAOCULAR LENS PLACEMENT ([place]);  Surgeon: Tonny Branch, MD;  Location: [facility];  Service: Ophthalmology;  Laterality: Right;  CDE: 12.67  . COLONOSCOPY    . CORONARY STENT INTERVENTION N/A 03/14/2017   Procedure: Coronary Stent Intervention;  Surgeon: Nelva Bush, MD;  Location: [place] CV LAB;  Service: Cardiovascular;  Laterality: N/A;  . HERNIA REPAIR    . INTRAVASCULAR ULTRASOUND/IVUS N/A 03/14/2017   Procedure: Intravascular Ultrasound/IVUS;  Surgeon: Nelva Bush, MD;  Location: [place] CV LAB;  Service: Cardiovascular;  Laterality: N/A;  . LAPAROSCOPIC RETROPUBIC PROSTATECTOMY    . LEFT HEART CATH AND CORONARY ANGIOGRAPHY N/A 03/14/2017   Procedure: Left Heart Cath and Coronary Angiography;  Surgeon: Nelva Bush, MD;  Location: [place] CV LAB;  Service: Cardiovascular;  Laterality: N/A;    Current Outpatient Medications  Medication Sig Dispense Refill  . acetaminophen (TYLENOL) 325 MG tablet Take 2 tablets (650 mg total) by mouth every 4 (four) hours as needed for headache or mild pain.    Marland Kitchen aspirin 81 MG chewable tablet Chew 1 tablet (81 mg total) by mouth daily.    . carbidopa-levodopa (SINEMET IR) 25-100 MG tablet Take 1 tablet by mouth 3 (three) times daily after meals.    . clopidogrel (PLAVIX) 75 MG tablet Take 1 tablet (75 mg total) by mouth daily. 90 tablet 3  . metoprolol succinate (TOPROL XL) 25 MG 24 hr tablet Take 0.5 tablets (12.5 mg total) by mouth daily. 45 tablet 3  . Multiple Vitamin (MULTIVITAMIN WITH MINERALS) TABS tablet Take 1 tablet by mouth daily.    . nitroGLYCERIN (NITROSTAT) 0.4 MG SL tablet Place 1 tablet (0.4 mg total) under the tongue every 5 (five) minutes as needed for chest pain.  25 tablet 2  . Omega-3 Fatty Acids (FISH OIL PO) Take 1 capsule by mouth at bedtime.     No current facility-administered medications for this visit.    Allergies:  Patient has no known allergies.   Social History: The patient  reports that  has never smoked. he has never used smokeless tobacco. He reports that he drinks alcohol. He reports that he does not use drugs.   ROS:  Please see the history of present illness. Otherwise, complete review of systems is positive for Parkinsonian tremor.  All other  systems are reviewed and negative.   Physical Exam: VS:  BP 104/60   Pulse 62   Ht 6\' 1"  (1.854 m)   Wt 186 lb 9.6 oz (84.6 kg)   SpO2 98%   BMI 24.62 kg/m , BMI Body mass index is 24.62 kg/m.  Wt Readings from Last 3 Encounters:  12/01/17 186 lb 9.6 oz (84.6 kg)  06/02/17 188 lb 3.2 oz (85.4 kg)  03/26/17 198 lb (89.8 kg)    General: Elderly male, appears comfortable at rest. HEENT: Conjunctiva and lids normal, oropharynx clear. Neck: Supple, no elevated JVP or carotid bruits, no thyromegaly. Lungs: Clear to auscultation, nonlabored breathing at rest. Cardiac: Regular rate and rhythm, no S3, soft systolic murmur, no pericardial rub. Abdomen: Soft, nontender, bowel sounds present. Extremities: No pitting edema, distal pulses 2+. Skin: Warm and dry. Musculoskeletal: No kyphosis. Neuropsychiatric: Alert and oriented x3, Parkinsonian facies and tremor.  ECG: I personally reviewed the tracing from 03/15/2017 which showed sinus bradycardia with anteroseptal infarct pattern.  Recent Labwork: 03/14/2017: ALT 18; AST 74 03/15/2017: Hemoglobin 13.9; Platelets 143 03/17/2017: BUN 22; Creatinine, Ser 1.68; Potassium 3.7; Sodium 133     Component Value Date/Time   CHOL 141 03/14/2017 1633   TRIG 46 03/14/2017 1633   HDL 33 (L) 03/14/2017 1633   CHOLHDL 4.3 03/14/2017 1633   VLDL 9 03/14/2017 1633   LDLCALC 99 03/14/2017 1633    Other Studies Reviewed Today:  Echocardiogram 05/09/2017: Study Conclusions  - Left ventricle: The cavity size was normal. Wall thickness was   increased in a pattern of mild LVH. The estimated ejection   fraction was 50%. There is akinesis of the mid-apicalanteroseptal   myocardium. Doppler parameters are consistent with abnormal left   ventricular relaxation (grade 1 diastolic dysfunction). - Aortic valve: There was trivial regurgitation. - Mitral valve: Mildly thickened leaflets . There was mild   regurgitation. - Right atrium: Central venous pressure  (est): 8 mm Hg. - Tricuspid valve: There was trivial regurgitation. - Pulmonary arteries: PA peak pressure: 34 mm Hg (S). - Pericardium, extracardiac: There was no pericardial effusion.  Impressions:  - Mild LVH with LVEF approximately 50%. There is mid to apical   anteroseptal akinesis. Grade 1 diastolic dysfunction. Mildly   thickened mitral valve with mild mitral regurgitation. Trivial   aortic regurgitation. Trivial tricuspid regurgitation with PASP   estimated 34 mmHg.  Assessment and Plan:  1. CAD status post DES 3 to the LAD in the setting of anterior STEMI in May 2018. Residual OM 2 disease to be managed medically. He reports no active angina at this time and will continue on dual antiplatelet therapy.  2. Ischemic cardiomyopathy with LVEF 50% by last assessment. No evidence of fluid overload.  3. Mixed hyperlipidemia, he decided to stop Lipitor, does not want to adjust medications at this time. I have encouraged him to keep up with his lipid panel in the interim, he  has scheduled follow-up through the [facility] medical system. We could always consider lower dose Lipitor or a different statin preparation.  4. Bradycardia, improved. Beta blocker dose was reduced.  Current medicines were reviewed with the patient today.  Disposition: Follow-up in one year (patient request).  Signed, Satira Sark, MD, [facility] 12/01/2017 1:16 PM    [facility] [place], [place], [place] [address] Phone: [phone]; Fax: [phone]

## 2017-12-01 ENCOUNTER — Encounter: Payer: Self-pay | Admitting: Cardiology

## 2017-12-01 ENCOUNTER — Ambulatory Visit: Payer: Medicare PPO | Admitting: Cardiology

## 2017-12-01 VITALS — BP 104/60 | HR 62 | Ht 73.0 in | Wt 186.6 lb

## 2017-12-01 DIAGNOSIS — I255 Ischemic cardiomyopathy: Secondary | ICD-10-CM | POA: Diagnosis not present

## 2017-12-01 DIAGNOSIS — R001 Bradycardia, unspecified: Secondary | ICD-10-CM | POA: Diagnosis not present

## 2017-12-01 DIAGNOSIS — I25119 Atherosclerotic heart disease of native coronary artery with unspecified angina pectoris: Secondary | ICD-10-CM | POA: Diagnosis not present

## 2017-12-01 DIAGNOSIS — E782 Mixed hyperlipidemia: Secondary | ICD-10-CM

## 2017-12-01 NOTE — Patient Instructions (Signed)

## 2018-04-21 DIAGNOSIS — I2583 Coronary atherosclerosis due to lipid rich plaque: Secondary | ICD-10-CM | POA: Diagnosis not present

## 2018-04-21 DIAGNOSIS — G20 Parkinson's disease: Secondary | ICD-10-CM | POA: Diagnosis not present

## 2018-04-21 DIAGNOSIS — Z6823 Body mass index (BMI) 23.0-23.9, adult: Secondary | ICD-10-CM | POA: Diagnosis not present

## 2018-04-21 DIAGNOSIS — M545 Low back pain: Secondary | ICD-10-CM | POA: Diagnosis not present

## 2018-04-21 DIAGNOSIS — G250 Essential tremor: Secondary | ICD-10-CM | POA: Diagnosis not present

## 2018-04-21 DIAGNOSIS — E7849 Other hyperlipidemia: Secondary | ICD-10-CM | POA: Diagnosis not present

## 2018-08-26 DIAGNOSIS — R531 Weakness: Secondary | ICD-10-CM | POA: Diagnosis not present

## 2018-08-26 DIAGNOSIS — R269 Unspecified abnormalities of gait and mobility: Secondary | ICD-10-CM | POA: Diagnosis not present

## 2018-08-26 DIAGNOSIS — R5381 Other malaise: Secondary | ICD-10-CM | POA: Diagnosis not present

## 2018-08-26 DIAGNOSIS — Z4789 Encounter for other orthopedic aftercare: Secondary | ICD-10-CM | POA: Diagnosis not present

## 2018-08-26 DIAGNOSIS — Z955 Presence of coronary angioplasty implant and graft: Secondary | ICD-10-CM | POA: Diagnosis not present

## 2018-08-26 DIAGNOSIS — I951 Orthostatic hypotension: Secondary | ICD-10-CM | POA: Diagnosis not present

## 2018-08-26 DIAGNOSIS — Z9889 Other specified postprocedural states: Secondary | ICD-10-CM | POA: Diagnosis not present

## 2018-08-26 DIAGNOSIS — Z7409 Other reduced mobility: Secondary | ICD-10-CM | POA: Diagnosis not present

## 2018-08-26 DIAGNOSIS — W19XXXA Unspecified fall, initial encounter: Secondary | ICD-10-CM | POA: Diagnosis not present

## 2018-08-26 DIAGNOSIS — R2689 Other abnormalities of gait and mobility: Secondary | ICD-10-CM | POA: Diagnosis not present

## 2018-08-26 DIAGNOSIS — M25551 Pain in right hip: Secondary | ICD-10-CM | POA: Diagnosis not present

## 2018-08-26 DIAGNOSIS — S72141A Displaced intertrochanteric fracture of right femur, initial encounter for closed fracture: Secondary | ICD-10-CM | POA: Diagnosis not present

## 2018-08-26 DIAGNOSIS — S72142A Displaced intertrochanteric fracture of left femur, initial encounter for closed fracture: Secondary | ICD-10-CM | POA: Diagnosis not present

## 2018-08-26 DIAGNOSIS — Z9181 History of falling: Secondary | ICD-10-CM | POA: Diagnosis not present

## 2018-08-26 DIAGNOSIS — S72001A Fracture of unspecified part of neck of right femur, initial encounter for closed fracture: Secondary | ICD-10-CM | POA: Diagnosis not present

## 2018-08-26 DIAGNOSIS — M6281 Muscle weakness (generalized): Secondary | ICD-10-CM | POA: Diagnosis not present

## 2018-08-26 DIAGNOSIS — Y929 Unspecified place or not applicable: Secondary | ICD-10-CM | POA: Diagnosis not present

## 2018-08-26 DIAGNOSIS — Z8546 Personal history of malignant neoplasm of prostate: Secondary | ICD-10-CM | POA: Diagnosis not present

## 2018-08-26 DIAGNOSIS — S72143A Displaced intertrochanteric fracture of unspecified femur, initial encounter for closed fracture: Secondary | ICD-10-CM | POA: Diagnosis not present

## 2018-08-26 DIAGNOSIS — G20 Parkinson's disease: Secondary | ICD-10-CM | POA: Diagnosis not present

## 2018-08-26 DIAGNOSIS — N179 Acute kidney failure, unspecified: Secondary | ICD-10-CM | POA: Diagnosis not present

## 2018-08-26 DIAGNOSIS — S72141S Displaced intertrochanteric fracture of right femur, sequela: Secondary | ICD-10-CM | POA: Diagnosis not present

## 2018-08-26 DIAGNOSIS — I2510 Atherosclerotic heart disease of native coronary artery without angina pectoris: Secondary | ICD-10-CM | POA: Diagnosis not present

## 2018-08-26 DIAGNOSIS — R251 Tremor, unspecified: Secondary | ICD-10-CM | POA: Diagnosis not present

## 2018-08-26 DIAGNOSIS — S72141D Displaced intertrochanteric fracture of right femur, subsequent encounter for closed fracture with routine healing: Secondary | ICD-10-CM | POA: Diagnosis not present

## 2018-08-26 DIAGNOSIS — I10 Essential (primary) hypertension: Secondary | ICD-10-CM | POA: Diagnosis not present

## 2018-09-04 DIAGNOSIS — S72001A Fracture of unspecified part of neck of right femur, initial encounter for closed fracture: Secondary | ICD-10-CM | POA: Diagnosis not present

## 2018-09-04 DIAGNOSIS — R2689 Other abnormalities of gait and mobility: Secondary | ICD-10-CM | POA: Diagnosis not present

## 2018-09-04 DIAGNOSIS — Z9181 History of falling: Secondary | ICD-10-CM | POA: Diagnosis not present

## 2018-09-04 DIAGNOSIS — S72141D Displaced intertrochanteric fracture of right femur, subsequent encounter for closed fracture with routine healing: Secondary | ICD-10-CM | POA: Diagnosis not present

## 2018-09-04 DIAGNOSIS — I25118 Atherosclerotic heart disease of native coronary artery with other forms of angina pectoris: Secondary | ICD-10-CM | POA: Diagnosis not present

## 2018-09-04 DIAGNOSIS — M25551 Pain in right hip: Secondary | ICD-10-CM | POA: Diagnosis not present

## 2018-09-04 DIAGNOSIS — N179 Acute kidney failure, unspecified: Secondary | ICD-10-CM | POA: Diagnosis not present

## 2018-09-04 DIAGNOSIS — G20 Parkinson's disease: Secondary | ICD-10-CM | POA: Diagnosis not present

## 2018-09-04 DIAGNOSIS — S72001D Fracture of unspecified part of neck of right femur, subsequent encounter for closed fracture with routine healing: Secondary | ICD-10-CM | POA: Diagnosis not present

## 2018-09-04 DIAGNOSIS — S72141S Displaced intertrochanteric fracture of right femur, sequela: Secondary | ICD-10-CM | POA: Diagnosis not present

## 2018-09-04 DIAGNOSIS — I951 Orthostatic hypotension: Secondary | ICD-10-CM | POA: Diagnosis not present

## 2018-09-04 DIAGNOSIS — Z4789 Encounter for other orthopedic aftercare: Secondary | ICD-10-CM | POA: Diagnosis not present

## 2018-09-04 DIAGNOSIS — M6281 Muscle weakness (generalized): Secondary | ICD-10-CM | POA: Diagnosis not present

## 2018-09-04 DIAGNOSIS — I2510 Atherosclerotic heart disease of native coronary artery without angina pectoris: Secondary | ICD-10-CM | POA: Diagnosis not present

## 2018-09-05 DIAGNOSIS — G20 Parkinson's disease: Secondary | ICD-10-CM | POA: Diagnosis not present

## 2018-09-05 DIAGNOSIS — I25118 Atherosclerotic heart disease of native coronary artery with other forms of angina pectoris: Secondary | ICD-10-CM | POA: Diagnosis not present

## 2018-09-05 DIAGNOSIS — I951 Orthostatic hypotension: Secondary | ICD-10-CM | POA: Diagnosis not present

## 2018-09-05 DIAGNOSIS — M25551 Pain in right hip: Secondary | ICD-10-CM | POA: Diagnosis not present

## 2018-09-16 DIAGNOSIS — S72001D Fracture of unspecified part of neck of right femur, subsequent encounter for closed fracture with routine healing: Secondary | ICD-10-CM | POA: Diagnosis not present

## 2018-09-26 DIAGNOSIS — G20 Parkinson's disease: Secondary | ICD-10-CM | POA: Diagnosis not present

## 2018-09-26 DIAGNOSIS — Z7901 Long term (current) use of anticoagulants: Secondary | ICD-10-CM | POA: Diagnosis not present

## 2018-09-26 DIAGNOSIS — I2510 Atherosclerotic heart disease of native coronary artery without angina pectoris: Secondary | ICD-10-CM | POA: Diagnosis not present

## 2018-09-26 DIAGNOSIS — I252 Old myocardial infarction: Secondary | ICD-10-CM | POA: Diagnosis not present

## 2018-09-26 DIAGNOSIS — I951 Orthostatic hypotension: Secondary | ICD-10-CM | POA: Diagnosis not present

## 2018-09-26 DIAGNOSIS — I255 Ischemic cardiomyopathy: Secondary | ICD-10-CM | POA: Diagnosis not present

## 2018-09-26 DIAGNOSIS — S72141D Displaced intertrochanteric fracture of right femur, subsequent encounter for closed fracture with routine healing: Secondary | ICD-10-CM | POA: Diagnosis not present

## 2018-09-26 DIAGNOSIS — I509 Heart failure, unspecified: Secondary | ICD-10-CM | POA: Diagnosis not present

## 2018-09-26 DIAGNOSIS — E782 Mixed hyperlipidemia: Secondary | ICD-10-CM | POA: Diagnosis not present

## 2018-09-28 ENCOUNTER — Other Ambulatory Visit: Payer: Self-pay | Admitting: *Deleted

## 2018-09-28 NOTE — Patient Outreach (Signed)
Drew Sanders) Care Management  09/28/2018  Drew Sanders Feb 19, 1941 [id number]   EMMI-general discharge- Drew Sanders   RED ON [place] Day # 1 Date: Friday 09/25/18 1439  Red Alert Reason: unfilled prescriptions? Yes    Insurance: Psychologist, prison and probation services and veteran's administration [facility] admissions  ED visits in the last 6 months    Outreach attempt # 1, successful at his home number  Patient is able to verify HIPAA Drew Sanders reviewed and addressed red alert with patient  Drew Sanders informed Sanders he has one pill to get tomorrow, 09/29/18 He reviewed all his medications with Drew [facility] Sanders and he has obtained all the medications for parkinson and cardiac medications   During the call he gave Sanders the permission to speak with his male friend so he could go to find a pill bottle Sanders reviewed the purpose of the call with her and she confirmed he had all his medications except a medication for rheumatoid arthritis that he could pick up on 09/29/18  Drew Sanders informed Sanders he has been taking his BP because he is on midodrine to increase his BP if it becomes too low but he confirms his sbp has been from 115-150 He has not had to take the Midodrine   He denies any medical concerns at this time   Social: Drew Sanders lives with his male friend, reports he is independent with his care and he or his friend transports him to medical appointments   Conditions: parkinson, CAD, hypotension, NSTEMI, NSVT, HDL, s/p primary angioplasty with coronary stent  [name] and broke leg before going to a snf but better now   DME BP cuff  Medications: He gets most of his medications from Drew Sanders and has no cost concerns did not get filled rheumatism pill    Appointments: to see primary MD on 09/30/18  He sees his [facility] MD in Drew Sanders need for assist with advance directives   Consent: [facility] Sanders Sanders reviewed [facility] services  with patient. Patient gave verbal consent for services.   Advised patient that there will be further automated EMMI- post discharge calls to assess how the patient is doing following the recent hospitalization Advised the patient that another call may be received from a nurse if any of their responses were abnormal. Patient voiced understanding and was appreciative of f/u call.   Plan: Drew Sanders will close case at this time as patient has been assessed and no needs identified.   Drew Sanders sent a successful outreach letter as discussed with [facility] brochure enclosed for review  Pt encouraged to return a call to Drew Sanders prn   Drew Trice L. Lavina Hamman, Sanders, BSN, [place] Coordinator Office number [phone] Mobile number [phone]  Main [facility] number [phone] Fax number [phone]

## 2018-09-30 DIAGNOSIS — I951 Orthostatic hypotension: Secondary | ICD-10-CM | POA: Diagnosis not present

## 2018-09-30 DIAGNOSIS — Z6822 Body mass index (BMI) 22.0-22.9, adult: Secondary | ICD-10-CM | POA: Diagnosis not present

## 2018-09-30 DIAGNOSIS — R630 Anorexia: Secondary | ICD-10-CM | POA: Diagnosis not present

## 2018-10-02 ENCOUNTER — Telehealth: Payer: Self-pay | Admitting: Cardiology

## 2018-10-02 DIAGNOSIS — I2510 Atherosclerotic heart disease of native coronary artery without angina pectoris: Secondary | ICD-10-CM | POA: Diagnosis not present

## 2018-10-02 DIAGNOSIS — G20 Parkinson's disease: Secondary | ICD-10-CM | POA: Diagnosis not present

## 2018-10-02 DIAGNOSIS — I509 Heart failure, unspecified: Secondary | ICD-10-CM | POA: Diagnosis not present

## 2018-10-02 DIAGNOSIS — I252 Old myocardial infarction: Secondary | ICD-10-CM | POA: Diagnosis not present

## 2018-10-02 DIAGNOSIS — S72141D Displaced intertrochanteric fracture of right femur, subsequent encounter for closed fracture with routine healing: Secondary | ICD-10-CM | POA: Diagnosis not present

## 2018-10-02 DIAGNOSIS — E782 Mixed hyperlipidemia: Secondary | ICD-10-CM | POA: Diagnosis not present

## 2018-10-02 DIAGNOSIS — I951 Orthostatic hypotension: Secondary | ICD-10-CM | POA: Diagnosis not present

## 2018-10-02 DIAGNOSIS — I255 Ischemic cardiomyopathy: Secondary | ICD-10-CM | POA: Diagnosis not present

## 2018-10-02 DIAGNOSIS — Z7901 Long term (current) use of anticoagulants: Secondary | ICD-10-CM | POA: Diagnosis not present

## 2018-10-02 NOTE — Telephone Encounter (Signed)
Patient called stating that he is having issues with his BP.

## 2018-10-02 NOTE — Telephone Encounter (Signed)
Per patient, when he is sitting down or laying down, his BP is 124/78-80. When he gets up quickly, his BP drops to 84/66 and he becomes dizzy and sob. Per patient, he was told at Rehab if BP gets too low, he needed to decrease his "pill". Advised patient not to change positions quickly. Monitor his BP daily and whenever he is having symptoms, record readings and bring to next appointment. Advised patient if he doesn't improve after changing positions slowly, to contact our office and also he could get an appointment with his PCP for an evaluation. Verbalized understanding.

## 2018-10-05 DIAGNOSIS — E782 Mixed hyperlipidemia: Secondary | ICD-10-CM | POA: Diagnosis not present

## 2018-10-05 DIAGNOSIS — Z7901 Long term (current) use of anticoagulants: Secondary | ICD-10-CM | POA: Diagnosis not present

## 2018-10-05 DIAGNOSIS — I509 Heart failure, unspecified: Secondary | ICD-10-CM | POA: Diagnosis not present

## 2018-10-05 DIAGNOSIS — I2510 Atherosclerotic heart disease of native coronary artery without angina pectoris: Secondary | ICD-10-CM | POA: Diagnosis not present

## 2018-10-05 DIAGNOSIS — S72141D Displaced intertrochanteric fracture of right femur, subsequent encounter for closed fracture with routine healing: Secondary | ICD-10-CM | POA: Diagnosis not present

## 2018-10-05 DIAGNOSIS — I252 Old myocardial infarction: Secondary | ICD-10-CM | POA: Diagnosis not present

## 2018-10-05 DIAGNOSIS — I951 Orthostatic hypotension: Secondary | ICD-10-CM | POA: Diagnosis not present

## 2018-10-05 DIAGNOSIS — G20 Parkinson's disease: Secondary | ICD-10-CM | POA: Diagnosis not present

## 2018-10-05 DIAGNOSIS — I255 Ischemic cardiomyopathy: Secondary | ICD-10-CM | POA: Diagnosis not present

## 2018-10-05 NOTE — Telephone Encounter (Signed)
Noted.  He is on low-dose Toprol-XL which we may ultimately need to stop.  I suspect he has orthostatic intolerance in the setting of autonomic dysfunction with Parkinson's disease.  May even need other measures such as compression stockings or perhaps even Florinef.  We will see how he does.

## 2018-10-09 DIAGNOSIS — I2510 Atherosclerotic heart disease of native coronary artery without angina pectoris: Secondary | ICD-10-CM | POA: Diagnosis not present

## 2018-10-09 DIAGNOSIS — I255 Ischemic cardiomyopathy: Secondary | ICD-10-CM | POA: Diagnosis not present

## 2018-10-09 DIAGNOSIS — I252 Old myocardial infarction: Secondary | ICD-10-CM | POA: Diagnosis not present

## 2018-10-09 DIAGNOSIS — S72141D Displaced intertrochanteric fracture of right femur, subsequent encounter for closed fracture with routine healing: Secondary | ICD-10-CM | POA: Diagnosis not present

## 2018-10-09 DIAGNOSIS — I951 Orthostatic hypotension: Secondary | ICD-10-CM | POA: Diagnosis not present

## 2018-10-09 DIAGNOSIS — G20 Parkinson's disease: Secondary | ICD-10-CM | POA: Diagnosis not present

## 2018-10-09 DIAGNOSIS — Z7901 Long term (current) use of anticoagulants: Secondary | ICD-10-CM | POA: Diagnosis not present

## 2018-10-09 DIAGNOSIS — E782 Mixed hyperlipidemia: Secondary | ICD-10-CM | POA: Diagnosis not present

## 2018-10-09 DIAGNOSIS — I509 Heart failure, unspecified: Secondary | ICD-10-CM | POA: Diagnosis not present

## 2018-10-13 DIAGNOSIS — I252 Old myocardial infarction: Secondary | ICD-10-CM | POA: Diagnosis not present

## 2018-10-13 DIAGNOSIS — I509 Heart failure, unspecified: Secondary | ICD-10-CM | POA: Diagnosis not present

## 2018-10-13 DIAGNOSIS — G20 Parkinson's disease: Secondary | ICD-10-CM | POA: Diagnosis not present

## 2018-10-13 DIAGNOSIS — I2510 Atherosclerotic heart disease of native coronary artery without angina pectoris: Secondary | ICD-10-CM | POA: Diagnosis not present

## 2018-10-13 DIAGNOSIS — E782 Mixed hyperlipidemia: Secondary | ICD-10-CM | POA: Diagnosis not present

## 2018-10-13 DIAGNOSIS — I255 Ischemic cardiomyopathy: Secondary | ICD-10-CM | POA: Diagnosis not present

## 2018-10-13 DIAGNOSIS — S72141D Displaced intertrochanteric fracture of right femur, subsequent encounter for closed fracture with routine healing: Secondary | ICD-10-CM | POA: Diagnosis not present

## 2018-10-13 DIAGNOSIS — Z7901 Long term (current) use of anticoagulants: Secondary | ICD-10-CM | POA: Diagnosis not present

## 2018-10-13 DIAGNOSIS — I951 Orthostatic hypotension: Secondary | ICD-10-CM | POA: Diagnosis not present

## 2018-10-14 DIAGNOSIS — S72001D Fracture of unspecified part of neck of right femur, subsequent encounter for closed fracture with routine healing: Secondary | ICD-10-CM | POA: Diagnosis not present

## 2018-10-15 DIAGNOSIS — I2510 Atherosclerotic heart disease of native coronary artery without angina pectoris: Secondary | ICD-10-CM | POA: Diagnosis not present

## 2018-10-15 DIAGNOSIS — I255 Ischemic cardiomyopathy: Secondary | ICD-10-CM | POA: Diagnosis not present

## 2018-10-15 DIAGNOSIS — E782 Mixed hyperlipidemia: Secondary | ICD-10-CM | POA: Diagnosis not present

## 2018-10-15 DIAGNOSIS — S72141D Displaced intertrochanteric fracture of right femur, subsequent encounter for closed fracture with routine healing: Secondary | ICD-10-CM | POA: Diagnosis not present

## 2018-10-15 DIAGNOSIS — I951 Orthostatic hypotension: Secondary | ICD-10-CM | POA: Diagnosis not present

## 2018-10-15 DIAGNOSIS — I509 Heart failure, unspecified: Secondary | ICD-10-CM | POA: Diagnosis not present

## 2018-10-15 DIAGNOSIS — Z7901 Long term (current) use of anticoagulants: Secondary | ICD-10-CM | POA: Diagnosis not present

## 2018-10-15 DIAGNOSIS — G20 Parkinson's disease: Secondary | ICD-10-CM | POA: Diagnosis not present

## 2018-10-15 DIAGNOSIS — I252 Old myocardial infarction: Secondary | ICD-10-CM | POA: Diagnosis not present

## 2018-10-16 ENCOUNTER — Telehealth: Payer: Self-pay | Admitting: Cardiology

## 2018-10-16 NOTE — Telephone Encounter (Signed)
Patient called stating that he recently broke his hip. He has started having shortness of breath  and low BP.

## 2018-10-16 NOTE — Telephone Encounter (Signed)
Pt says BP has been running 90s-100s/50s-70s HR 60s-70s and c/o dizziness when changing positions - Dr Zada Girt started midodrine as needed when BP <100 - pt denies SOB/CP - stopped taking Toprol XL - pt was at [facility] in October after fall and says they placed a metal rod in his leg and then transferred to [facility] for rehab which he has completed - has upcoming appt with Dr Domenic Polite 1/27

## 2018-10-19 NOTE — Telephone Encounter (Signed)
This is not something that we are going to be able to handle over the phone as there are several different possibilities to consider.  I would recommend that he follow-up with his PCP to review current medications and vital signs.  If we need to try and move up his follow-up visit with [place], this can be arranged.

## 2018-10-19 NOTE — Telephone Encounter (Signed)
Pt has appt with pcp Wednesday at 1pm - also wanted to be place on wait list for Dr Domenic Polite

## 2018-10-19 NOTE — Telephone Encounter (Signed)
Patient called stating that he continues to have shortness of breath.

## 2018-10-21 DIAGNOSIS — Z0000 Encounter for general adult medical examination without abnormal findings: Secondary | ICD-10-CM | POA: Diagnosis not present

## 2018-10-21 DIAGNOSIS — I951 Orthostatic hypotension: Secondary | ICD-10-CM | POA: Diagnosis not present

## 2018-10-21 DIAGNOSIS — Z6823 Body mass index (BMI) 23.0-23.9, adult: Secondary | ICD-10-CM | POA: Diagnosis not present

## 2018-10-21 DIAGNOSIS — Z1389 Encounter for screening for other disorder: Secondary | ICD-10-CM | POA: Diagnosis not present

## 2018-11-25 DIAGNOSIS — S72001D Fracture of unspecified part of neck of right femur, subsequent encounter for closed fracture with routine healing: Secondary | ICD-10-CM | POA: Diagnosis not present

## 2018-12-07 ENCOUNTER — Encounter

## 2018-12-07 ENCOUNTER — Ambulatory Visit: Payer: Medicare PPO | Admitting: Cardiology

## 2018-12-07 ENCOUNTER — Encounter: Payer: Self-pay | Admitting: Cardiology

## 2018-12-07 VITALS — BP 164/78 | HR 65 | Ht 72.0 in | Wt 176.4 lb

## 2018-12-07 DIAGNOSIS — E782 Mixed hyperlipidemia: Secondary | ICD-10-CM

## 2018-12-07 DIAGNOSIS — R001 Bradycardia, unspecified: Secondary | ICD-10-CM | POA: Diagnosis not present

## 2018-12-07 DIAGNOSIS — I951 Orthostatic hypotension: Secondary | ICD-10-CM

## 2018-12-07 DIAGNOSIS — I25119 Atherosclerotic heart disease of native coronary artery with unspecified angina pectoris: Secondary | ICD-10-CM

## 2018-12-07 MED ORDER — FLUDROCORTISONE ACETATE 0.1 MG PO TABS: 0.1000 mg | ORAL_TABLET | Freq: Every day | ORAL | 1 refills | Status: DC

## 2018-12-07 NOTE — Progress Notes (Signed)
Cardiology Office Note  Date: 12/07/2018   ID: Drew Sanders, DOB 02-20-1941, MRN [id number]  PCP: Neale Burly, MD  Primary Cardiologist: Rozann Lesches, MD   Chief Complaint  Patient presents with  . Cardiac follow-up    History of Present Illness: Drew Sanders is a 78 y.o. male last seen in January 2019.  He presents for a routine follow-up visit.  He reports no progressive angina symptoms or nitroglycerin use.  He has been limited by progressive Parkinson's disease, using a walker today. Interval telephone notes reviewed, he has had trouble with symptomatic orthostasis, was placed on midodrine  by Dr. Sherrie Sport and also taken off Toprol-XL.  I suspect this is related to autonomic dysfunction with his Parkinson's disease.  He is using Midrin only as needed instead of on a standing basis, feels like it pushes his blood pressure too high if he takes it every day.  We talked about trying Florinef instead.  I personally reviewed his ECG today which shows sinus rhythm with lead motion artifact.  The remainder of his cardiac regimen is stable.  Past Medical History:  Diagnosis Date  . Acute ST elevation myocardial infarction (STEMI) of anterior wall [facility])    May 2018  . CAD (coronary artery disease)    DES 3 to LAD May 2018  . Hyperlipidemia   . Parkinson disease ([place])   . Prostate cancer [facility])     Past Surgical History:  Procedure Laterality Date  . CARDIAC CATHETERIZATION     normal  . CATARACT EXTRACTION W/PHACO Left 04/06/2015   Procedure: CATARACT EXTRACTION PHACO AND INTRAOCULAR LENS PLACEMENT :  CDE:  9.79;  Surgeon: Tonny Branch, MD;  Location: [facility];  Service: Ophthalmology;  Laterality: Left;  . CATARACT EXTRACTION W/PHACO Right 04/24/2015   Procedure: CATARACT EXTRACTION PHACO AND INTRAOCULAR LENS PLACEMENT ([place]);  Surgeon: Tonny Branch, MD;  Location: [facility];  Service: Ophthalmology;  Laterality: Right;  CDE: 12.67  . COLONOSCOPY    . CORONARY STENT INTERVENTION N/A  03/14/2017   Procedure: Coronary Stent Intervention;  Surgeon: Nelva Bush, MD;  Location: [place] CV LAB;  Service: Cardiovascular;  Laterality: N/A;  . HERNIA REPAIR    . INTRAVASCULAR ULTRASOUND/IVUS N/A 03/14/2017   Procedure: Intravascular Ultrasound/IVUS;  Surgeon: Nelva Bush, MD;  Location: [place] CV LAB;  Service: Cardiovascular;  Laterality: N/A;  . LAPAROSCOPIC RETROPUBIC PROSTATECTOMY    . LEFT HEART CATH AND CORONARY ANGIOGRAPHY N/A 03/14/2017   Procedure: Left Heart Cath and Coronary Angiography;  Surgeon: Nelva Bush, MD;  Location: [place] [facility] LAB;  Service: Cardiovascular;  Laterality: N/A;    Current Outpatient Medications  Medication Sig Dispense Refill  . aspirin 81 MG chewable tablet Chew 1 tablet (81 mg total) by mouth daily. (Patient taking differently: Chew 81 mg by mouth. Takes randomly.)    . Calcium Carbonate-Vitamin D (CALCIUM-VITAMIN D3 PO) Take by mouth. Take 2 tabs by mouth every morning & 1 tab at dinner time    . carbidopa-levodopa (SINEMET IR) 25-100 MG tablet Take 2 tablets by mouth 4 (four) times daily.     . clopidogrel (PLAVIX) 75 MG tablet Take 1 tablet (75 mg total) by mouth daily. 90 tablet 3  . Coenzyme Q10-Vitamin E (QUNOL ULTRA COQ10 PO) Take 1 tablet by mouth daily.    . megestrol (MEGACE) 20 MG tablet Take 20 mg by mouth daily.    . midodrine (PROAMATINE) 5 MG tablet Take 5 mg by mouth as needed.    Marland Kitchen  Multiple Vitamin (MULTIVITAMIN WITH MINERALS) TABS tablet Take 1 tablet by mouth daily.    . nitroGLYCERIN (NITROSTAT) 0.4 MG SL tablet Place 1 tablet (0.4 mg total) under the tongue every 5 (five) minutes as needed for chest pain. 25 tablet 2  . Omega-3 Fatty Acids (FISH OIL PO) Take 1 capsule by mouth at bedtime.    . rasagiline (AZILECT) 0.5 MG TABS tablet Take 0.5 mg by mouth daily.    . fludrocortisone (FLORINEF) 0.1 MG tablet Take 1 tablet (0.1 mg total) by mouth daily. 90 tablet 1   No current facility-administered  medications for this visit.    Allergies:  Patient has no known allergies.   Social History: The patient  reports that he has never smoked. He has never used smokeless tobacco. He reports current alcohol use. He reports that he does not use drugs.   ROS:  Please see the history of present illness. Otherwise, complete review of systems is positive for tremors, shuffling gait.  All other systems are reviewed and negative.   Physical Exam: VS:  BP (!) 164/78   Pulse 65   Ht 6' (1.829 m)   Wt 176 lb 6.4 oz (80 kg)   SpO2 100%   BMI 23.92 kg/m , BMI Body mass index is 23.92 kg/m.  Wt Readings from Last 3 Encounters:  12/07/18 176 lb 6.4 oz (80 kg)  12/01/17 186 lb 9.6 oz (84.6 kg)  06/02/17 188 lb 3.2 oz (85.4 kg)    General: Elderly male, using a walker. HEENT: Conjunctiva and lids normal, oropharynx clear. Neck: Supple, no elevated JVP or carotid bruits, no thyromegaly. Lungs: Clear to auscultation, nonlabored breathing at rest. Cardiac: Regular rate and rhythm, no S3, soft systolic murmur. Abdomen: Soft, nontender, bowel sounds present. Extremities: No pitting edema, distal pulses 2+. Skin: Warm and dry. Musculoskeletal: No kyphosis. Neuropsychiatric: Alert and oriented x3, affect grossly appropriate.  ECG: I personally reviewed the tracing from 03/15/2017 which showed sinus bradycardia with anteroseptal infarct pattern.  Recent Labwork:    Component Value Date/Time   CHOL 141 03/14/2017 1633   TRIG 46 03/14/2017 1633   HDL 33 (L) 03/14/2017 1633   CHOLHDL 4.3 03/14/2017 1633   VLDL 9 03/14/2017 1633   LDLCALC 99 03/14/2017 1633  May 2018: ALT 18; AST 74, Hemoglobin 13.9; Platelets 143, BUN 22; Creatinine, Ser 1.68; Potassium 3.7; Sodium 133   Other Studies Reviewed Today:  Echocardiogram 05/09/2017: Study Conclusions  - Left ventricle: The cavity size was normal. Wall thickness was increased in a pattern of mild LVH. The estimated ejection fraction was 50%.  There is akinesis of the mid-apicalanteroseptal myocardium. Doppler parameters are consistent with abnormal left ventricular relaxation (grade 1 diastolic dysfunction). - Aortic valve: There was trivial regurgitation. - Mitral valve: Mildly thickened leaflets . There was mild regurgitation. - Right atrium: Central venous pressure (est): 8 mm Hg. - Tricuspid valve: There was trivial regurgitation. - Pulmonary arteries: PA peak pressure: 34 mm Hg (S). - Pericardium, extracardiac: There was no pericardial effusion.  Impressions:  - Mild LVH with LVEF approximately 50%. There is mid to apical anteroseptal akinesis. Grade 1 diastolic dysfunction. Mildly thickened mitral valve with mild mitral regurgitation. Trivial aortic regurgitation. Trivial tricuspid regurgitation with PASP estimated 34 mmHg.  Assessment and Plan:  1.  Orthostatic hypotension, likely secondary to autonomic dysfunction with Parkinson's disease.  We will try Florinef 0.1 mg daily since he has not been using midodrine regularly.  We discussed compression hose as well.  2.  CAD status post DES x3 to the LAD with anterior STEMI in May 2018.  He reports no active angina symptoms on medical therapy.  3.  Mixed hyperlipidemia, previously on Lipitor.  He has decided to stop statin therapy and is not interested in another preparation.  Regimen does include omega-3 supplements and co-Q10.  4.  Bradycardia, resolved.  Beta-blocker is now discontinued.  Current medicines were reviewed with the patient today.   Orders Placed This Encounter  Procedures  . EKG 12-Lead    Disposition: Follow-up in 6 months.  Signed, Satira Sark, MD, [facility] 12/07/2018 4:23 PM    [facility] [place], [place], [address] [address] Phone: [phone]; Fax: [phone]

## 2018-12-07 NOTE — Patient Instructions (Addendum)
Medication Instructions:   Your physician has recommended you make the following change in your medication:   Start florinef 0.1 mg by mouth daily  Continue all other medications the same  Labwork:  NONE  Testing/Procedures:  NONE  Follow-Up:  Your physician recommends that you schedule a follow-up appointment in: 6 months. You will receive a reminder letter in the mail in about 4 months reminding you to call and schedule your appointment. If you don't receive this letter, please contact our office.  Any Other Special Instructions Will Be Listed Below (If Applicable).  If you need a refill on your cardiac medications before your next appointment, please call your pharmacy.

## 2018-12-11 ENCOUNTER — Telehealth: Payer: Self-pay

## 2018-12-11 ENCOUNTER — Telehealth: Payer: Self-pay | Admitting: Cardiology

## 2018-12-11 NOTE — Telephone Encounter (Signed)
Then his orthostatic dizziness is likely to continue.  This will be difficult to manage as he likely has autonomic dysfunction in the setting of Parkinson's disease.  It is not uncommon to have fluctuations between high and low blood pressure in this setting.

## 2018-12-11 NOTE — Telephone Encounter (Signed)
Patient confirmed that he has not been taking midodrine. Patient informed r/e recommendations from Dr. Domenic Polite. Patient declined compression stockings at this time.

## 2018-12-11 NOTE — Telephone Encounter (Signed)
Please make sure that he has not been taking midodrine at the same time.  If he has not been taking midodrine at the same time, we may not be able to use Florinef since that is already the lowest dose.  Other option would be for [place] to provide him therapeutic below the knee compression stockings which could be prescribed.

## 2018-12-11 NOTE — Telephone Encounter (Signed)
Pt is having really high BP readings still starting fludrocortisone (FLORINEF) 0.1 MG tablet [[id number]]   Reading this morning after he took the pill was 184/91  Yesterday afternoon it was 200/90  Please give pt a call [phone]

## 2018-12-11 NOTE — Telephone Encounter (Signed)
Patient says the florinef is causing his BP to get too high. Started taking florinef 0.1 mg on Wednesday 12/09/2018 BP was 108/67 that morning. 12/10/2018 morning BP was 179/91 and did not take florinef and on recheck it was 200/90. Today, morning BP was 91/63 took florinef 0.1 mg at 8:00 am Rechecked BP and it was 184/91 @1 :30 pm.

## 2018-12-15 NOTE — Telephone Encounter (Signed)
Patient informed and verbalized understanding

## 2019-02-03 DIAGNOSIS — I10 Essential (primary) hypertension: Secondary | ICD-10-CM | POA: Diagnosis not present

## 2019-02-03 DIAGNOSIS — Z6824 Body mass index (BMI) 24.0-24.9, adult: Secondary | ICD-10-CM | POA: Diagnosis not present

## 2019-02-03 DIAGNOSIS — G20 Parkinson's disease: Secondary | ICD-10-CM | POA: Diagnosis not present

## 2019-06-07 DIAGNOSIS — Z6823 Body mass index (BMI) 23.0-23.9, adult: Secondary | ICD-10-CM | POA: Diagnosis not present

## 2019-06-07 DIAGNOSIS — I2510 Atherosclerotic heart disease of native coronary artery without angina pectoris: Secondary | ICD-10-CM | POA: Diagnosis not present

## 2019-06-07 DIAGNOSIS — Z87891 Personal history of nicotine dependence: Secondary | ICD-10-CM | POA: Diagnosis not present

## 2019-06-07 DIAGNOSIS — M545 Low back pain: Secondary | ICD-10-CM | POA: Diagnosis not present

## 2019-06-07 DIAGNOSIS — I252 Old myocardial infarction: Secondary | ICD-10-CM | POA: Diagnosis not present

## 2019-06-07 DIAGNOSIS — Z8546 Personal history of malignant neoplasm of prostate: Secondary | ICD-10-CM | POA: Diagnosis not present

## 2019-06-07 DIAGNOSIS — G20 Parkinson's disease: Secondary | ICD-10-CM | POA: Diagnosis not present

## 2019-06-07 DIAGNOSIS — Z7902 Long term (current) use of antithrombotics/antiplatelets: Secondary | ICD-10-CM | POA: Diagnosis not present

## 2019-06-07 DIAGNOSIS — I10 Essential (primary) hypertension: Secondary | ICD-10-CM | POA: Diagnosis not present

## 2019-08-04 DIAGNOSIS — Z6823 Body mass index (BMI) 23.0-23.9, adult: Secondary | ICD-10-CM | POA: Diagnosis not present

## 2019-08-04 DIAGNOSIS — I10 Essential (primary) hypertension: Secondary | ICD-10-CM | POA: Diagnosis not present

## 2019-08-04 DIAGNOSIS — R630 Anorexia: Secondary | ICD-10-CM | POA: Diagnosis not present

## 2019-08-04 DIAGNOSIS — I950 Idiopathic hypotension: Secondary | ICD-10-CM | POA: Diagnosis not present

## 2019-08-04 DIAGNOSIS — G20 Parkinson's disease: Secondary | ICD-10-CM | POA: Diagnosis not present

## 2020-02-01 DIAGNOSIS — Z1331 Encounter for screening for depression: Secondary | ICD-10-CM | POA: Diagnosis not present

## 2020-02-01 DIAGNOSIS — G20 Parkinson's disease: Secondary | ICD-10-CM | POA: Diagnosis not present

## 2020-02-01 DIAGNOSIS — I950 Idiopathic hypotension: Secondary | ICD-10-CM | POA: Diagnosis not present

## 2020-02-01 DIAGNOSIS — I10 Essential (primary) hypertension: Secondary | ICD-10-CM | POA: Diagnosis not present

## 2020-02-01 DIAGNOSIS — R630 Anorexia: Secondary | ICD-10-CM | POA: Diagnosis not present

## 2020-02-01 DIAGNOSIS — N182 Chronic kidney disease, stage 2 (mild): Secondary | ICD-10-CM | POA: Diagnosis not present

## 2020-02-01 DIAGNOSIS — Z0000 Encounter for general adult medical examination without abnormal findings: Secondary | ICD-10-CM | POA: Diagnosis not present

## 2020-02-01 DIAGNOSIS — Z6824 Body mass index (BMI) 24.0-24.9, adult: Secondary | ICD-10-CM | POA: Diagnosis not present

## 2020-05-25 DIAGNOSIS — Z0389 Encounter for observation for other suspected diseases and conditions ruled out: Secondary | ICD-10-CM | POA: Diagnosis not present

## 2020-05-25 DIAGNOSIS — M810 Age-related osteoporosis without current pathological fracture: Secondary | ICD-10-CM | POA: Diagnosis not present

## 2020-06-28 DIAGNOSIS — Z6823 Body mass index (BMI) 23.0-23.9, adult: Secondary | ICD-10-CM | POA: Diagnosis not present

## 2020-06-28 DIAGNOSIS — G20 Parkinson's disease: Secondary | ICD-10-CM | POA: Diagnosis not present

## 2020-06-28 DIAGNOSIS — Z0000 Encounter for general adult medical examination without abnormal findings: Secondary | ICD-10-CM | POA: Diagnosis not present

## 2020-06-28 DIAGNOSIS — I10 Essential (primary) hypertension: Secondary | ICD-10-CM | POA: Diagnosis not present

## 2020-06-28 DIAGNOSIS — N182 Chronic kidney disease, stage 2 (mild): Secondary | ICD-10-CM | POA: Diagnosis not present

## 2020-06-28 DIAGNOSIS — I950 Idiopathic hypotension: Secondary | ICD-10-CM | POA: Diagnosis not present

## 2020-12-25 DIAGNOSIS — I10 Essential (primary) hypertension: Secondary | ICD-10-CM | POA: Diagnosis not present

## 2020-12-25 DIAGNOSIS — I950 Idiopathic hypotension: Secondary | ICD-10-CM | POA: Diagnosis not present

## 2020-12-25 DIAGNOSIS — N182 Chronic kidney disease, stage 2 (mild): Secondary | ICD-10-CM | POA: Diagnosis not present

## 2020-12-25 DIAGNOSIS — G20 Parkinson's disease: Secondary | ICD-10-CM | POA: Diagnosis not present

## 2020-12-25 DIAGNOSIS — Z6822 Body mass index (BMI) 22.0-22.9, adult: Secondary | ICD-10-CM | POA: Diagnosis not present

## 2021-02-27 DIAGNOSIS — Z23 Encounter for immunization: Secondary | ICD-10-CM | POA: Diagnosis not present

## 2021-06-25 DIAGNOSIS — I10 Essential (primary) hypertension: Secondary | ICD-10-CM | POA: Diagnosis not present

## 2021-06-25 DIAGNOSIS — I950 Idiopathic hypotension: Secondary | ICD-10-CM | POA: Diagnosis not present

## 2021-06-25 DIAGNOSIS — Z6823 Body mass index (BMI) 23.0-23.9, adult: Secondary | ICD-10-CM | POA: Diagnosis not present

## 2021-06-25 DIAGNOSIS — Z0000 Encounter for general adult medical examination without abnormal findings: Secondary | ICD-10-CM | POA: Diagnosis not present

## 2021-06-25 DIAGNOSIS — N182 Chronic kidney disease, stage 2 (mild): Secondary | ICD-10-CM | POA: Diagnosis not present

## 2021-06-25 DIAGNOSIS — G20 Parkinson's disease: Secondary | ICD-10-CM | POA: Diagnosis not present

## 2021-08-21 DIAGNOSIS — Z23 Encounter for immunization: Secondary | ICD-10-CM | POA: Diagnosis not present

## 2021-09-06 DIAGNOSIS — Z6823 Body mass index (BMI) 23.0-23.9, adult: Secondary | ICD-10-CM | POA: Diagnosis not present

## 2021-09-06 DIAGNOSIS — L0390 Cellulitis, unspecified: Secondary | ICD-10-CM | POA: Diagnosis not present

## 2021-11-15 ENCOUNTER — Encounter (HOSPITAL_COMMUNITY): Payer: Self-pay | Admitting: Emergency Medicine

## 2021-11-15 ENCOUNTER — Emergency Department (HOSPITAL_COMMUNITY): Payer: No Typology Code available for payment source

## 2021-11-15 ENCOUNTER — Emergency Department (HOSPITAL_COMMUNITY)
Admission: EM | Admit: 2021-11-15 | Discharge: 2021-11-16 | Discharge status: Against Medical Advice | Payer: No Typology Code available for payment source | Attending: Emergency Medicine | Admitting: Emergency Medicine

## 2021-11-15 DIAGNOSIS — G20 Parkinson's disease: Secondary | ICD-10-CM | POA: Insufficient documentation

## 2021-11-15 DIAGNOSIS — Z7982 Long term (current) use of aspirin: Secondary | ICD-10-CM | POA: Diagnosis not present

## 2021-11-15 DIAGNOSIS — B9689 Other specified bacterial agents as the cause of diseases classified elsewhere: Secondary | ICD-10-CM | POA: Insufficient documentation

## 2021-11-15 DIAGNOSIS — R531 Weakness: Secondary | ICD-10-CM | POA: Diagnosis not present

## 2021-11-15 DIAGNOSIS — R079 Chest pain, unspecified: Secondary | ICD-10-CM | POA: Diagnosis not present

## 2021-11-15 DIAGNOSIS — R0789 Other chest pain: Secondary | ICD-10-CM | POA: Diagnosis present

## 2021-11-15 DIAGNOSIS — N390 Urinary tract infection, site not specified: Secondary | ICD-10-CM | POA: Diagnosis not present

## 2021-11-15 DIAGNOSIS — I2510 Atherosclerotic heart disease of native coronary artery without angina pectoris: Secondary | ICD-10-CM | POA: Diagnosis not present

## 2021-11-15 DIAGNOSIS — Z7901 Long term (current) use of anticoagulants: Secondary | ICD-10-CM | POA: Insufficient documentation

## 2021-11-15 DIAGNOSIS — R9431 Abnormal electrocardiogram [ECG] [EKG]: Secondary | ICD-10-CM | POA: Diagnosis not present

## 2021-11-15 LAB — CBC WITH DIFFERENTIAL/PLATELET
Abs Immature Granulocytes: 0.09 10*3/uL — ABNORMAL HIGH (ref 0.00–0.07)
Basophils Absolute: 0 10*3/uL (ref 0.0–0.1)
Basophils Relative: 0 %
Eosinophils Absolute: 0.1 10*3/uL (ref 0.0–0.5)
Eosinophils Relative: 1 %
HCT: 44.7 % (ref 39.0–52.0)
Hemoglobin: 15.3 g/dL (ref 13.0–17.0)
Immature Granulocytes: 1 %
Lymphocytes Relative: 10 %
Lymphs Abs: 1.4 10*3/uL (ref 0.7–4.0)
MCH: 33.5 pg (ref 26.0–34.0)
MCHC: 34.2 g/dL (ref 30.0–36.0)
MCV: 97.8 fL (ref 80.0–100.0)
Monocytes Absolute: 1.6 10*3/uL — ABNORMAL HIGH (ref 0.1–1.0)
Monocytes Relative: 11 %
Neutro Abs: 10.8 10*3/uL — ABNORMAL HIGH (ref 1.7–7.7)
Neutrophils Relative %: 77 %
Platelets: 141 10*3/uL — ABNORMAL LOW (ref 150–400)
RBC: 4.57 MIL/uL (ref 4.22–5.81)
RDW: 12.5 % (ref 11.5–15.5)
WBC: 13.9 10*3/uL — ABNORMAL HIGH (ref 4.0–10.5)
nRBC: 0 % (ref 0.0–0.2)

## 2021-11-15 LAB — BASIC METABOLIC PANEL
Anion gap: 11 (ref 5–15)
BUN: 33 mg/dL — ABNORMAL HIGH (ref 8–23)
CO2: 21 mmol/L — ABNORMAL LOW (ref 22–32)
Calcium: 9.1 mg/dL (ref 8.9–10.3)
Chloride: 104 mmol/L (ref 98–111)
Creatinine, Ser: 1.64 mg/dL — ABNORMAL HIGH (ref 0.61–1.24)
GFR, Estimated: 42 mL/min — ABNORMAL LOW (ref >60)
Glucose, Bld: 105 mg/dL — ABNORMAL HIGH (ref 70–99)
Potassium: 4.2 mmol/L (ref 3.5–5.1)
Sodium: 136 mmol/L (ref 135–145)

## 2021-11-15 LAB — TROPONIN I (HIGH SENSITIVITY)
Troponin I (High Sensitivity): 10 ng/L (ref <18)
Troponin I (High Sensitivity): 12 ng/L (ref <18)

## 2021-11-15 NOTE — ED Triage Notes (Signed)
Patient here for evaluation after an episode of chest pain last night, states pain resolved on its own. Patient states now today he is having difficulty walking. Patient alert, oriented, and in no apparent distress at this time.

## 2021-11-15 NOTE — ED Provider Triage Note (Signed)
Emergency Medicine Provider Triage Evaluation Note  Drew Sanders , a 81 y.o. male  was evaluated in triage.  Pt complains of CP. Began yesterday. No back pain, LE swelling. Some SOB, has DOE. No numbness. Some generalized weakness. Some pain to right shoulder. Hx of multiple stints with prior STEMI. Unsure if feels similar  Review of Systems  Positive: CP, SOB Negative: LE edema, numbness  Physical Exam  BP (!) 81/64 (BP Location: Left Arm)    Pulse 86    Temp 97.8 F (36.6 C) (Oral)    Resp 16    SpO2 100%  Gen:   Awake, no distress   Resp:  Normal effort  MSK:   Moves extremities without difficulty  Other:    Medical Decision Making  Medically screening exam initiated at 11:35 AM.  Appropriate orders placed.  Laurence Crofford was informed that the remainder of the evaluation will be completed by another provider, this initial triage assessment does not replace that evaluation, and the importance of remaining in the ED until their evaluation is complete.  CP, hypotensive, nursing aware patient needs room in back.   Lane Kjos A, PA-C 11/15/21 1136

## 2021-11-16 LAB — URINALYSIS, MICROSCOPIC (REFLEX)

## 2021-11-16 LAB — URINALYSIS, ROUTINE W REFLEX MICROSCOPIC
Bilirubin Urine: NEGATIVE
Glucose, UA: NEGATIVE mg/dL
Hgb urine dipstick: NEGATIVE
Ketones, ur: NEGATIVE mg/dL
Nitrite: NEGATIVE
Protein, ur: NEGATIVE mg/dL
Specific Gravity, Urine: >1.03 — ABNORMAL HIGH (ref 1.005–1.030)
pH: 5.5 (ref 5.0–8.0)

## 2021-11-16 MED ORDER — CEPHALEXIN 500 MG PO CAPS: 500.0000 mg | ORAL_CAPSULE | Freq: Four times a day (QID) | ORAL | 0 refills | Status: AC

## 2021-11-16 NOTE — ED Provider Notes (Signed)
[facility] EMERGENCY DEPARTMENT Provider Note   CSN: [id number] Arrival date & time: 11/15/21  1124     History  Chief Complaint  Patient presents with   Chest Pain    Drew Sanders is a 81 y.o. male presenting for evaluation of chest pain and generalized weakness.  Patient states yesterday he had chest pain.  Pain radiated to his shoulders, however then he does report chronic shoulder pain and states this feels similar.  Chest pain resolved after he took aspirin and Tums.  He has been chest pain-free since.  No associated shortness of breath, clamminess, nausea, or vomiting.  Patient states since the chest pain, he has been feeling weaker than normal, states it is harder for him to walk than normal.  He does have a walker, but does not use it.  He has a history of Parkinson's, states his walking is different from his baseline.  He denies recent fever, nasal congestion, sore throat, cough, abdominal pain, urinary symptoms, abnormal bowel movements.  No recent medication changes. Cardiac history significant for CAD resulting in DES x3 in 2018.  HPI     Home Medications Prior to Admission medications   Medication Sig Start Date End Date Taking? Authorizing Provider  apixaban (ELIQUIS) 2.5 MG TABS tablet Take 2.5 mg by mouth daily.   Yes [provider]  aspirin 81 MG chewable tablet Chew 1 tablet (81 mg total) by mouth daily. Patient taking differently: Chew 81 mg by mouth. Takes randomly. 03/16/17  Yes Kilroy, Luke K, PA-C  calcium carbonate (OS-CAL) 1250 (500 Ca) MG chewable tablet Chew 1,250 mg by mouth daily.   Yes [provider]  Calcium Carbonate-Vitamin D (CALCIUM-VITAMIN D3 PO) Take 1-2 tablets by mouth See admin instructions. Take 2 tabs by mouth every morning & 1 tab at dinner time   Yes [provider]  carbidopa-levodopa (SINEMET IR) 25-100 MG tablet Take 2 tablets by mouth 4 (four) times daily.    Yes [provider]   cephALEXin (KEFLEX) 500 MG capsule Take 1 capsule (500 mg total) by mouth 4 (four) times daily for 5 days. 11/16/21 11/21/21 Yes Ronni Osterberg, PA-C  clopidogrel (PLAVIX) 75 MG tablet Take 1 tablet (75 mg total) by mouth daily. 04/01/17  Yes Imogene Burn, PA-C  Coenzyme Q10-Vitamin E (QUNOL ULTRA COQ10 PO) Take 1 tablet by mouth daily.   Yes [provider]  KRILL OIL PO Take 1 tablet by mouth daily.   Yes [provider]  midodrine (PROAMATINE) 5 MG tablet Take 5 mg by mouth daily as needed (hypertension).   Yes [provider]  Multiple Vitamin (MULTIVITAMIN WITH MINERALS) TABS tablet Take 1 tablet by mouth daily.   Yes [provider]  nitroGLYCERIN (NITROSTAT) 0.4 MG SL tablet Place 1 tablet (0.4 mg total) under the tongue every 5 (five) minutes as needed for chest pain. 03/16/17  Yes Kilroy, Luke K, PA-C  rasagiline (AZILECT) 0.5 MG TABS tablet Take 0.5 mg by mouth daily.   Yes [provider]  White Petrolatum (WHITE PETROLEUM JELLY) GEL Apply 1 application topically daily as needed (rash).   Yes [provider]      Allergies    Patient has no known allergies.    Review of Systems   Review of Systems  Cardiovascular:  Positive for chest pain (Resolved).  Neurological:  Positive for weakness.  All other systems reviewed and are negative.  Physical Exam Updated Vital Signs BP 132/77  Pulse 86    Temp 97.7 F (36.5 C)    Resp 15    SpO2 100%  Physical Exam Vitals and nursing note reviewed.  Constitutional:      General: He is not in acute distress.    Appearance: Normal appearance.  HENT:     Head: Normocephalic and atraumatic.  Eyes:     Conjunctiva/sclera: Conjunctivae normal.     Pupils: Pupils are equal, round, and reactive to light.  Cardiovascular:     Rate and Rhythm: Normal rate and regular rhythm.     Pulses: Normal pulses.  Pulmonary:     Effort: Pulmonary effort is normal. No respiratory distress.      Breath sounds: Normal breath sounds. No wheezing.     Comments: Speaking in full sentences.  Clear lung sounds in all fields. Abdominal:     General: There is no distension.     Palpations: Abdomen is soft. There is no mass.     Tenderness: There is no abdominal tenderness. There is no guarding or rebound.  Musculoskeletal:        General: Normal range of motion.     Cervical back: Normal range of motion and neck supple.     Right lower leg: No edema.     Left lower leg: No edema.  Skin:    General: Skin is warm and dry.     Capillary Refill: Capillary refill takes less than 2 seconds.  Neurological:     Mental Status: He is alert and oriented to person, place, and time.  Psychiatric:        Mood and Affect: Mood and affect normal.        Speech: Speech normal.        Behavior: Behavior normal.    ED Results / Procedures / Treatments   Labs (all labs ordered are listed, but only abnormal results are displayed) Labs Reviewed  CBC WITH DIFFERENTIAL/PLATELET - Abnormal; Notable for the following components:      Result Value   WBC 13.9 (*)    Platelets 141 (*)    Neutro Abs 10.8 (*)    Monocytes Absolute 1.6 (*)    Abs Immature Granulocytes 0.09 (*)    All other components within normal limits  BASIC METABOLIC PANEL - Abnormal; Notable for the following components:   CO2 21 (*)    Glucose, Bld 105 (*)    BUN 33 (*)    Creatinine, Ser 1.64 (*)    GFR, Estimated 42 (*)    All other components within normal limits  URINALYSIS, ROUTINE W REFLEX MICROSCOPIC - Abnormal; Notable for the following components:   Color, Urine AMBER (*)    Specific Gravity, Urine >1.030 (*)    Leukocytes,Ua SMALL (*)    All other components within normal limits  URINALYSIS, MICROSCOPIC (REFLEX) - Abnormal; Notable for the following components:   Bacteria, UA MANY (*)    All other components within normal limits  TROPONIN I (HIGH SENSITIVITY)  TROPONIN I (HIGH SENSITIVITY)    EKG EKG  Interpretation  Date/Time:  Friday November 16 2021 07:32:47 EST Ventricular Rate:  76 PR Interval:  172 QRS Duration: 100 QT Interval:  401 QTC Calculation: 451 R Axis:   55 Text Interpretation: Sinus rhythm Anterior infarct, old Minimal ST depression, inferior leads Since prior ECG< more clearly sinus, artifact improved. Confirmed by Gareth Morgan [phone]) on 11/16/2021 7:48:33 AM  Radiology DG Chest 2 View  Result Date: 11/15/2021 CLINICAL DATA:  Chest pain EXAM: CHEST - 2 VIEW COMPARISON:  None available. FINDINGS: Cardiac and mediastinal contours are within normal limits. No focal pulmonary opacity. No pleural effusion or pneumothorax. No acute osseous abnormality. IMPRESSION: No acute cardiopulmonary process. Electronically Signed   By: Merilyn Baba M.D.   On: 11/15/2021 12:04    Procedures Procedures    Medications Ordered in ED Medications - No data to display  ED Course/ Medical Decision Making/ A&P                           Medical Decision Making   I evaluated patient more than 19 hrs after pt's arrival to the ER.    This patient presents to the ED for concern of chest pain and weakness.  This involves an extensive number of treatment options, and is a complaint that carries with it a high risk of complications and morbidity.  The differential diagnosis includes ACS, arrhythmia, infection, electrolyte abnormality, anemia   Co morbidities: ACS status post DES x3, Parkinson's   Additional history: Unable to see [place] records.  Reviewed 2020 cardiology records   Lab Tests:  Labs obtained from triage interpreted by me.  Shows baseline creatinine.  Mild leukocytosis of 13.9, likely reactive.  Hemoglobin stable.  Electrolytes overall reassuring.  Troponins negative x2.  We will add on UA for evaluation of UTI in the setting weakness.  Of note, patient's initial EKG is irregular, and looks as if it is A. fib, which patient does not have a history of.  However patient  does have Parkinson's, so artifact may be complicating the picture.  We will repeat EKG.  Repeat EKG no longer shows A. fib, does show normal sinus rhythm.  However may be slightly more Ischemic than initial EKG.  UA shows leukocytes, many bacteria, and 21-50 wbc. In the setting of weakness, will tx for UTI with abx.   Imaging Studies: chest x-ray obtained from triage viewed and independently interpreted by me, shows no pneumonia pneumothorax, effusion. I agree with the radiologist interpretation   Cardiac Monitoring:  The patient was maintained on a cardiac monitor.  I personally viewed and interpreted the cardiac monitored which showed an underlying rhythm of: NSR   Consults:  I requested consultation with the cardiology service.  Discussed with PA Eulas Post from cardiology, who reviewed the EKGs and agrees that this is abnormal.  Cardiology to evaluate the patient.   Dispostion:  Patient requesting to leave.  I discussed that his work-up is not complete, he has not yet been evaluated by cardiology.  Patient states he understands that his work-up is not complete, but still wants to leave.  I called cardiology, who felt they can see patient in 20 minutes, but patient is still adamant that he wants to leave.  I discussed risks of going home including worsening condition, including death.  Conversation was had in front of patient's friend, Lawana Chambers.  Patient states he understands the risks, but still wants to leave.  At this time, he appears to have capacity to make medical decisions for himself. Discussed with attending, Dr. Billy Fischer also talked about risks of leaving with pt, he would still like to go.  I discussed that his urine was consistent with infection, will send in antibiotics.  Stressed importance of calling his outpatient cardiologist today to set up a close follow-up appointment and prompt return to the ER with any worsening symptoms.  Final Clinical Impression(s) / ED  Diagnoses  Final diagnoses:  Urinary tract infection without hematuria, site unspecified  Atypical chest pain  Weakness  Abnormal EKG    Rx / DC Orders ED Discharge Orders          Ordered    cephALEXin (KEFLEX) 500 MG capsule  4 times daily        11/16/21 0844              Kingjames Coury, PA-C 11/16/21 2824    Gareth Morgan, MD 11/16/21 2228

## 2021-11-16 NOTE — ED Notes (Signed)
Pt stated he has waited to long and understand the risk of leaving. Pt signed the AMA form. Assisted pt with clothes and provided wheelchair. EDP is placing an order for antibiotics which will be picked up at the [facility] in [place].

## 2021-11-16 NOTE — ED Notes (Signed)
Pt stated he wants to leave because it is taking to long. Pt stated he is hungry. Pt family called for a ride and the ride is waiting for them in lobby. Notified EDP to discuss the risk of AMA.

## 2021-12-31 DIAGNOSIS — Z0000 Encounter for general adult medical examination without abnormal findings: Secondary | ICD-10-CM | POA: Diagnosis not present

## 2021-12-31 DIAGNOSIS — N182 Chronic kidney disease, stage 2 (mild): Secondary | ICD-10-CM | POA: Diagnosis not present

## 2021-12-31 DIAGNOSIS — G20 Parkinson's disease: Secondary | ICD-10-CM | POA: Diagnosis not present

## 2021-12-31 DIAGNOSIS — I950 Idiopathic hypotension: Secondary | ICD-10-CM | POA: Diagnosis not present

## 2021-12-31 DIAGNOSIS — Z6823 Body mass index (BMI) 23.0-23.9, adult: Secondary | ICD-10-CM | POA: Diagnosis not present

## 2021-12-31 DIAGNOSIS — I10 Essential (primary) hypertension: Secondary | ICD-10-CM | POA: Diagnosis not present

## 2022-01-25 DIAGNOSIS — I2510 Atherosclerotic heart disease of native coronary artery without angina pectoris: Secondary | ICD-10-CM | POA: Diagnosis not present

## 2022-01-25 DIAGNOSIS — Z79899 Other long term (current) drug therapy: Secondary | ICD-10-CM | POA: Diagnosis not present

## 2022-01-25 DIAGNOSIS — G20 Parkinson's disease: Secondary | ICD-10-CM | POA: Diagnosis not present

## 2022-01-25 DIAGNOSIS — T50905A Adverse effect of unspecified drugs, medicaments and biological substances, initial encounter: Secondary | ICD-10-CM | POA: Diagnosis not present

## 2022-01-25 DIAGNOSIS — T887XXA Unspecified adverse effect of drug or medicament, initial encounter: Secondary | ICD-10-CM | POA: Diagnosis not present

## 2022-01-25 DIAGNOSIS — R531 Weakness: Secondary | ICD-10-CM | POA: Diagnosis not present

## 2022-01-25 DIAGNOSIS — Z7902 Long term (current) use of antithrombotics/antiplatelets: Secondary | ICD-10-CM | POA: Diagnosis not present

## 2022-01-25 DIAGNOSIS — Z7982 Long term (current) use of aspirin: Secondary | ICD-10-CM | POA: Diagnosis not present

## 2022-03-04 DIAGNOSIS — A062 Amebic nondysenteric colitis: Secondary | ICD-10-CM | POA: Diagnosis not present

## 2022-03-04 DIAGNOSIS — Z6823 Body mass index (BMI) 23.0-23.9, adult: Secondary | ICD-10-CM | POA: Diagnosis not present

## 2022-03-06 DIAGNOSIS — A062 Amebic nondysenteric colitis: Secondary | ICD-10-CM | POA: Diagnosis not present

## 2022-04-04 ENCOUNTER — Ambulatory Visit
Admission: EM | Admit: 2022-04-04 | Discharge: 2022-04-04 | Disposition: A | Discharge status: Home / Self Care | Payer: No Typology Code available for payment source | Attending: Nurse Practitioner | Admitting: Nurse Practitioner

## 2022-04-04 DIAGNOSIS — B80 Enterobiasis: Secondary | ICD-10-CM | POA: Diagnosis not present

## 2022-04-04 MED ORDER — ALBENDAZOLE 200 MG PO TABS: 400.0000 mg | ORAL_TABLET | Freq: Two times a day (BID) | ORAL | 0 refills | Status: AC

## 2022-04-04 NOTE — Discharge Instructions (Signed)
Take medication as prescribed. Follow-up with your primary care or [place] doctor for further evaluation if your symptoms do not improve.

## 2022-04-04 NOTE — ED Provider Notes (Signed)
[facility]    CSN: [id number] Arrival date & time: 04/04/22  1215      History   Chief Complaint Chief Complaint  Patient presents with   pinworms    HPI Drew Sanders is a 81 y.o. male.   Patient is a 81 year old male who presents requesting medication for pinworms.  Patient and his wife state that he was seen at the [place] approximately 3 weeks ago for the same or similar symptoms.  Patient's wife states the patient was prescribed a medication "she does not recall the name" for his symptoms.  States that the medication caused $5000, and she did not pick it up.  Patient was then later prescribed ketoconazole cream which is not helping his symptoms.  Patient states that he continues to experience the worms after he defecates.  He states if it sits long enough, he will be able to see the worms in his stool.  He denies fever, chills, nausea, vomiting, or diarrhea.  Patient states he went to a local pharmacy and was told to purchase albendazole for his symptoms.  The history is provided by the patient.   Past Medical History:  Diagnosis Date   Acute ST elevation myocardial infarction (STEMI) of anterior wall [facility])    May 2018   CAD (coronary artery disease)    DES 3 to LAD May 2018   Hyperlipidemia    Parkinson disease ([place])    Prostate cancer [facility])     Patient Active Problem List   Diagnosis Date Noted   Ischemic cardiomyopathy 03/16/2017   Acute combined systolic and diastolic heart failure (HCC)    NSVT (nonsustained ventricular tachycardia) ([place])    Dyslipidemia 03/15/2017   Parkinson's disease ([place]) 03/15/2017   S/P primary angioplasty with coronary stent 03/15/2017   STEMI involving left anterior descending coronary artery ([place]) 03/14/2017    Past Surgical History:  Procedure Laterality Date   CARDIAC CATHETERIZATION     normal   CATARACT EXTRACTION W/PHACO Left 04/06/2015   Procedure: CATARACT EXTRACTION PHACO AND INTRAOCULAR LENS PLACEMENT :  CDE:   9.79;  Surgeon: Tonny Branch, MD;  Location: [facility];  Service: Ophthalmology;  Laterality: Left;   CATARACT EXTRACTION W/PHACO Right 04/24/2015   Procedure: CATARACT EXTRACTION PHACO AND INTRAOCULAR LENS PLACEMENT (IOC);  Surgeon: Tonny Branch, MD;  Location: [facility];  Service: Ophthalmology;  Laterality: Right;  CDE: 12.67   COLONOSCOPY     CORONARY STENT INTERVENTION N/A 03/14/2017   Procedure: Coronary Stent Intervention;  Surgeon: Nelva Bush, MD;  Location: [place] [facility];  Service: Cardiovascular;  Laterality: N/A;   HERNIA REPAIR     INTRAVASCULAR ULTRASOUND/IVUS N/A 03/14/2017   Procedure: Intravascular Ultrasound/IVUS;  Surgeon: Nelva Bush, MD;  Location: [place] [facility];  Service: Cardiovascular;  Laterality: N/A;   LAPAROSCOPIC RETROPUBIC PROSTATECTOMY     LEFT HEART CATH AND CORONARY ANGIOGRAPHY N/A 03/14/2017   Procedure: Left Heart Cath and Coronary Angiography;  Surgeon: Nelva Bush, MD;  Location: [place] [facility];  Service: Cardiovascular;  Laterality: N/A;       Home Medications    Prior to Admission medications   Medication Sig Start Date End Date Taking? Authorizing Provider  albendazole (ALBENZA) 200 MG tablet Take 2 tablets (400 mg total) by mouth 2 (two) times daily for 1 day. 04/04/22 04/05/22 Yes Mayar Whittier-Warren, Alda Lea, NP  apixaban (ELIQUIS) 2.5 MG TABS tablet Take 2.5 mg by mouth daily.    [provider]  aspirin 81 MG chewable  tablet Chew 1 tablet (81 mg total) by mouth daily. Patient taking differently: Chew 81 mg by mouth. Takes randomly. 03/16/17   Erlene Quan, PA-C  calcium carbonate (OS-CAL) 1250 (500 Ca) MG chewable tablet Chew 1,250 mg by mouth daily.    [provider]  Calcium Carbonate-Vitamin D (CALCIUM-VITAMIN D3 PO) Take 1-2 tablets by mouth See admin instructions. Take 2 tabs by mouth every morning & 1 tab at dinner time    [provider]  carbidopa-levodopa (SINEMET IR) 25-100 MG tablet Take 2 tablets by  mouth 4 (four) times daily.     [provider]  clopidogrel (PLAVIX) 75 MG tablet Take 1 tablet (75 mg total) by mouth daily. 04/01/17   Imogene Burn, PA-C  Coenzyme Q10-Vitamin E (QUNOL ULTRA COQ10 PO) Take 1 tablet by mouth daily.    [provider]  KRILL OIL PO Take 1 tablet by mouth daily.    [provider]  midodrine (PROAMATINE) 5 MG tablet Take 5 mg by mouth daily as needed (hypertension).    [provider]  Multiple Vitamin (MULTIVITAMIN WITH MINERALS) TABS tablet Take 1 tablet by mouth daily.    [provider]  nitroGLYCERIN (NITROSTAT) 0.4 MG SL tablet Place 1 tablet (0.4 mg total) under the tongue every 5 (five) minutes as needed for chest pain. 03/16/17   Erlene Quan, PA-C  rasagiline (AZILECT) 0.5 MG TABS tablet Take 0.5 mg by mouth daily.    [provider]  White Petrolatum (WHITE PETROLEUM JELLY) GEL Apply 1 application topically daily as needed (rash).    [provider]    Family History Family History  Problem Relation Age of Onset   Heart disease Father     Social History Social History   Tobacco Use   Smoking status: Never   Smokeless tobacco: Never  Vaping Use   Vaping Use: Never used  Substance Use Topics   Alcohol use: Yes    Comment: occ   Drug use: No     Allergies   Patient has no known allergies.   Review of Systems Review of Systems PER HPI  Physical Exam Triage Vital Signs ED Triage Vitals  Enc Vitals Group     BP 04/04/22 1259 102/65     Pulse Rate 04/04/22 1259 69     Resp 04/04/22 1259 16     Temp 04/04/22 1259 (!) 97.3 F (36.3 C)     Temp Source 04/04/22 1259 Oral     SpO2 04/04/22 1259 97 %     Weight --      Height --      Head Circumference --      Peak Flow --      Pain Score 04/04/22 1257 0     Pain Loc --      Pain Edu? --      Excl. in [place]? --    No data found.  Updated Vital Signs BP 102/65 (BP Location: Right Arm)   Pulse 69   Temp (!)  97.3 F (36.3 C) (Oral)   Resp 16   SpO2 97%   Visual Acuity Right Eye Distance:   Left Eye Distance:   Bilateral Distance:    Right Eye Near:   Left Eye Near:    Bilateral Near:     Physical Exam Vitals and nursing note reviewed.  Constitutional:      Appearance: Normal appearance.  HENT:     Head: Normocephalic and atraumatic.  Cardiovascular:     Rate and Rhythm: Normal rate and regular rhythm.     Pulses: Normal pulses.     Heart sounds: Normal heart sounds.  Pulmonary:     Effort: Pulmonary effort is normal.     Breath sounds: Normal breath sounds.  Abdominal:     General: Bowel sounds are normal.     Palpations: Abdomen is soft.     Tenderness: There is no abdominal tenderness.  Skin:    General: Skin is warm and dry.  Neurological:     General: No focal deficit present.     Mental Status: He is alert and oriented to person, place, and time.  Psychiatric:        Mood and Affect: Mood normal.        Behavior: Behavior normal.     UC Treatments / Results  Labs (all labs ordered are listed, but only abnormal results are displayed) Labs Reviewed - No data to display  EKG   Radiology No results found.  Procedures Procedures (including critical care time)  Medications Ordered in UC Medications - No data to display  Initial Impression / Assessment and Plan / UC Course  I have reviewed the triage vital signs and the nursing notes.  Pertinent labs & imaging results that were available during my care of the patient were reviewed by me and considered in my medical decision making (see chart for details).  Patient presents with 3-week history of pinworms.  Patient was treated by his [place] doctors without success.  Patient presents requesting albendazole for symptoms as this was advised by the pharmacy.  His exam is stable, he is in no acute distress.  Prescription was sent to the patient's preferred pharmacy.  Patient was advised to follow-up with his primary  care or [place] physician if symptoms do not improve. Final Clinical Impressions(s) / UC Diagnoses   Final diagnoses:  Pinworms     Discharge Instructions      Take medication as prescribed. Follow-up with your primary care or [place] doctor for further evaluation if your symptoms do not improve.     ED Prescriptions     Medication Sig Dispense Auth. Provider   albendazole (ALBENZA) 200 MG tablet Take 2 tablets (400 mg total) by mouth 2 (two) times daily for 1 day. 4 tablet [place] Streat-Warren, Alda Lea, NP      PDMP not reviewed this encounter.   Tish Men, NP 04/04/22 1327

## 2022-04-04 NOTE — ED Triage Notes (Signed)
Pt states he was diagnosed with Pinworms about 3 weeks ago   Pt states he tried some OTC meds without any relief  Pt states the [place] in [place] told him to get a Rx for Albendazole x4 tablets

## 2022-05-30 DIAGNOSIS — D472 Monoclonal gammopathy: Secondary | ICD-10-CM | POA: Diagnosis not present

## 2022-05-31 DIAGNOSIS — I2510 Atherosclerotic heart disease of native coronary artery without angina pectoris: Secondary | ICD-10-CM | POA: Diagnosis not present

## 2022-05-31 DIAGNOSIS — Z8546 Personal history of malignant neoplasm of prostate: Secondary | ICD-10-CM | POA: Diagnosis not present

## 2022-05-31 DIAGNOSIS — C8300 Small cell B-cell lymphoma, unspecified site: Secondary | ICD-10-CM | POA: Diagnosis not present

## 2022-05-31 DIAGNOSIS — Z79899 Other long term (current) drug therapy: Secondary | ICD-10-CM | POA: Diagnosis not present

## 2022-05-31 DIAGNOSIS — D472 Monoclonal gammopathy: Secondary | ICD-10-CM | POA: Diagnosis not present

## 2022-05-31 DIAGNOSIS — Z7902 Long term (current) use of antithrombotics/antiplatelets: Secondary | ICD-10-CM | POA: Diagnosis not present

## 2022-05-31 DIAGNOSIS — G20 Parkinson's disease: Secondary | ICD-10-CM | POA: Diagnosis not present

## 2022-05-31 DIAGNOSIS — Z7982 Long term (current) use of aspirin: Secondary | ICD-10-CM | POA: Diagnosis not present

## 2022-07-09 DIAGNOSIS — C8580 Other specified types of non-Hodgkin lymphoma, unspecified site: Secondary | ICD-10-CM | POA: Diagnosis not present

## 2022-07-09 DIAGNOSIS — C61 Malignant neoplasm of prostate: Secondary | ICD-10-CM | POA: Diagnosis not present

## 2022-07-09 DIAGNOSIS — D472 Monoclonal gammopathy: Secondary | ICD-10-CM | POA: Diagnosis not present

## 2022-07-19 ENCOUNTER — Other Ambulatory Visit (HOSPITAL_COMMUNITY): Payer: Self-pay | Admitting: Radiation Oncology

## 2022-07-19 DIAGNOSIS — C61 Malignant neoplasm of prostate: Secondary | ICD-10-CM

## 2022-08-05 ENCOUNTER — Ambulatory Visit (HOSPITAL_COMMUNITY)
Admission: RE | Admit: 2022-08-05 | Discharge: 2022-08-05 | Disposition: A | Discharge status: Home / Self Care | Payer: No Typology Code available for payment source | Source: Ambulatory Visit | Attending: Radiation Oncology | Admitting: Radiation Oncology

## 2022-08-05 DIAGNOSIS — C61 Malignant neoplasm of prostate: Secondary | ICD-10-CM | POA: Insufficient documentation

## 2022-08-05 MED ORDER — PIFLIFOLASTAT F 18 (PYLARIFY) INJECTION
9.0000 | Freq: Once | INTRAVENOUS | Status: AC
  Administered 2022-08-05: 8.61 via INTRAVENOUS

## 2022-08-07 DIAGNOSIS — C8580 Other specified types of non-Hodgkin lymphoma, unspecified site: Secondary | ICD-10-CM | POA: Diagnosis not present

## 2023-03-03 DIAGNOSIS — C61 Malignant neoplasm of prostate: Secondary | ICD-10-CM | POA: Diagnosis not present

## 2023-03-03 DIAGNOSIS — D472 Monoclonal gammopathy: Secondary | ICD-10-CM | POA: Diagnosis not present

## 2023-03-03 DIAGNOSIS — C8580 Other specified types of non-Hodgkin lymphoma, unspecified site: Secondary | ICD-10-CM | POA: Diagnosis not present

## 2023-05-29 DIAGNOSIS — G20A1 Parkinson's disease without dyskinesia, without mention of fluctuations: Secondary | ICD-10-CM | POA: Diagnosis not present

## 2023-05-29 DIAGNOSIS — R531 Weakness: Secondary | ICD-10-CM | POA: Diagnosis not present

## 2023-05-29 DIAGNOSIS — C61 Malignant neoplasm of prostate: Secondary | ICD-10-CM | POA: Diagnosis not present

## 2023-08-16 IMAGING — CR DG CHEST 2V
2 series · 2 of 2 positions shown · non-contrast
Comparison: None available.

CLINICAL DATA: Chest pain

EXAM:
CHEST - 2 VIEW

[chest lat]
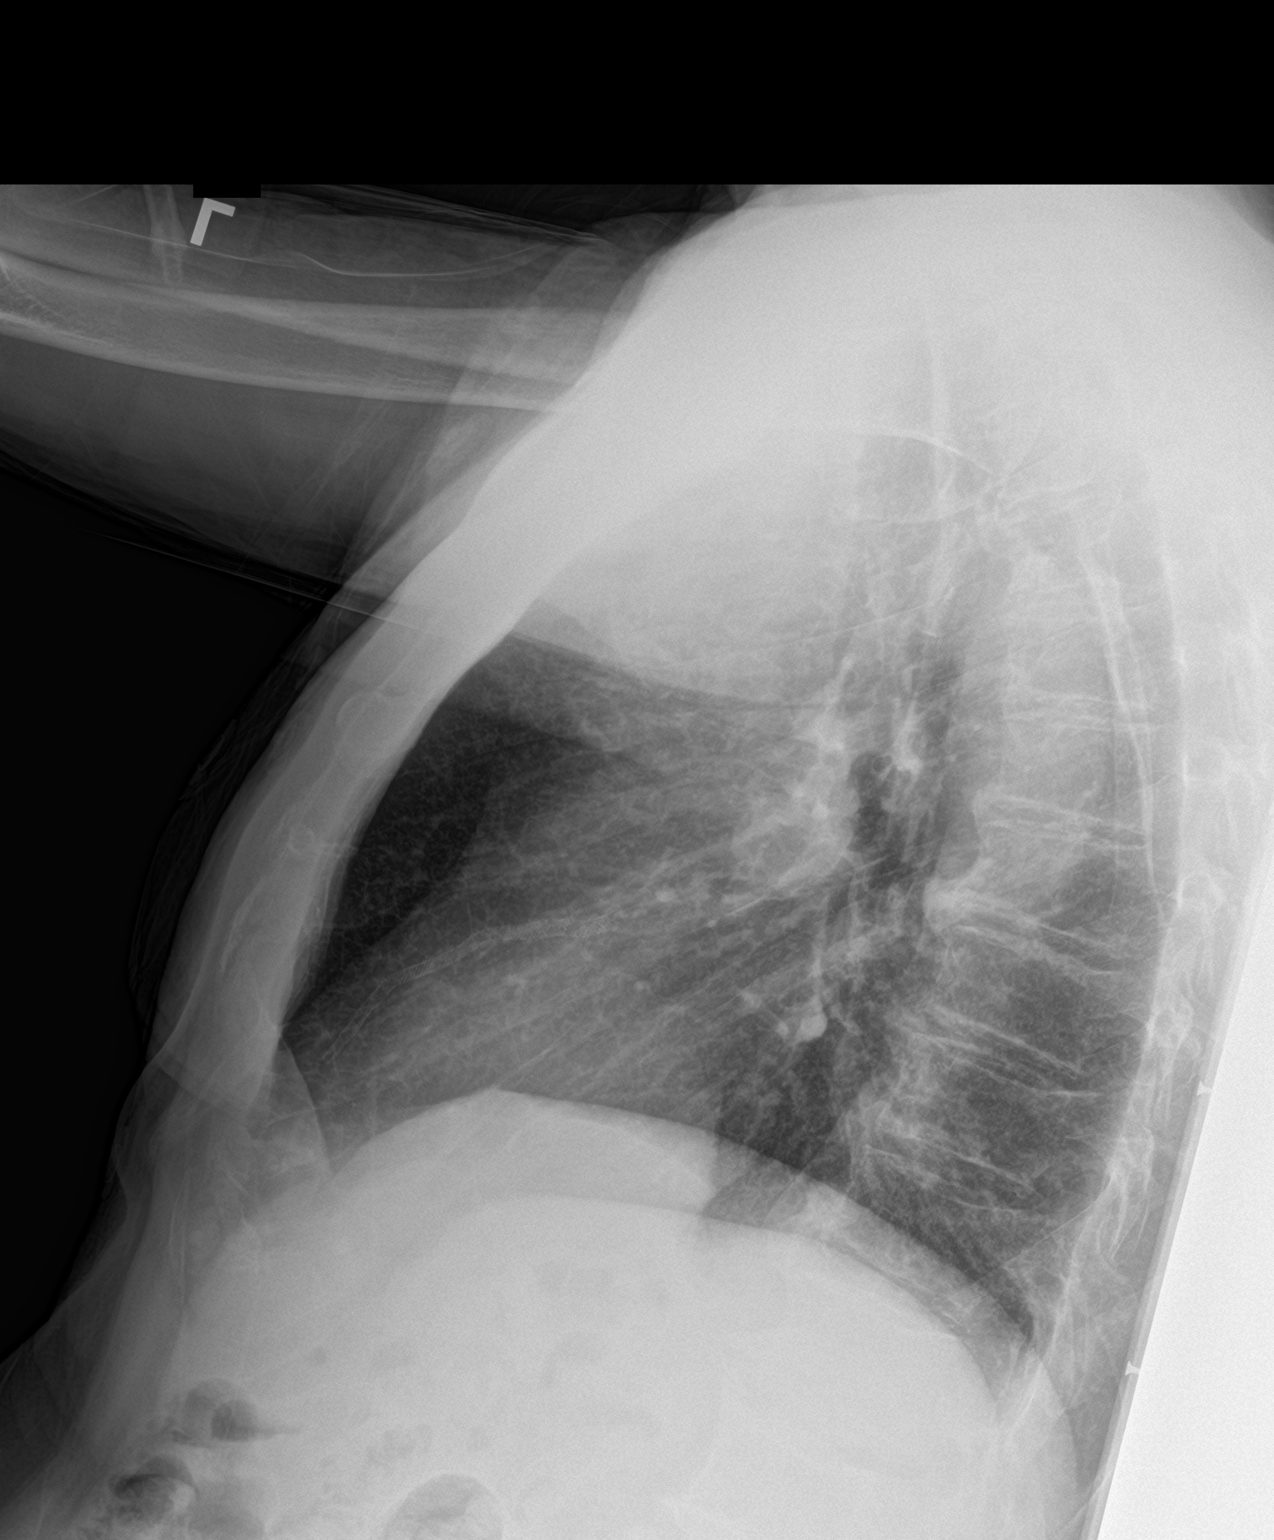

[chest ap]
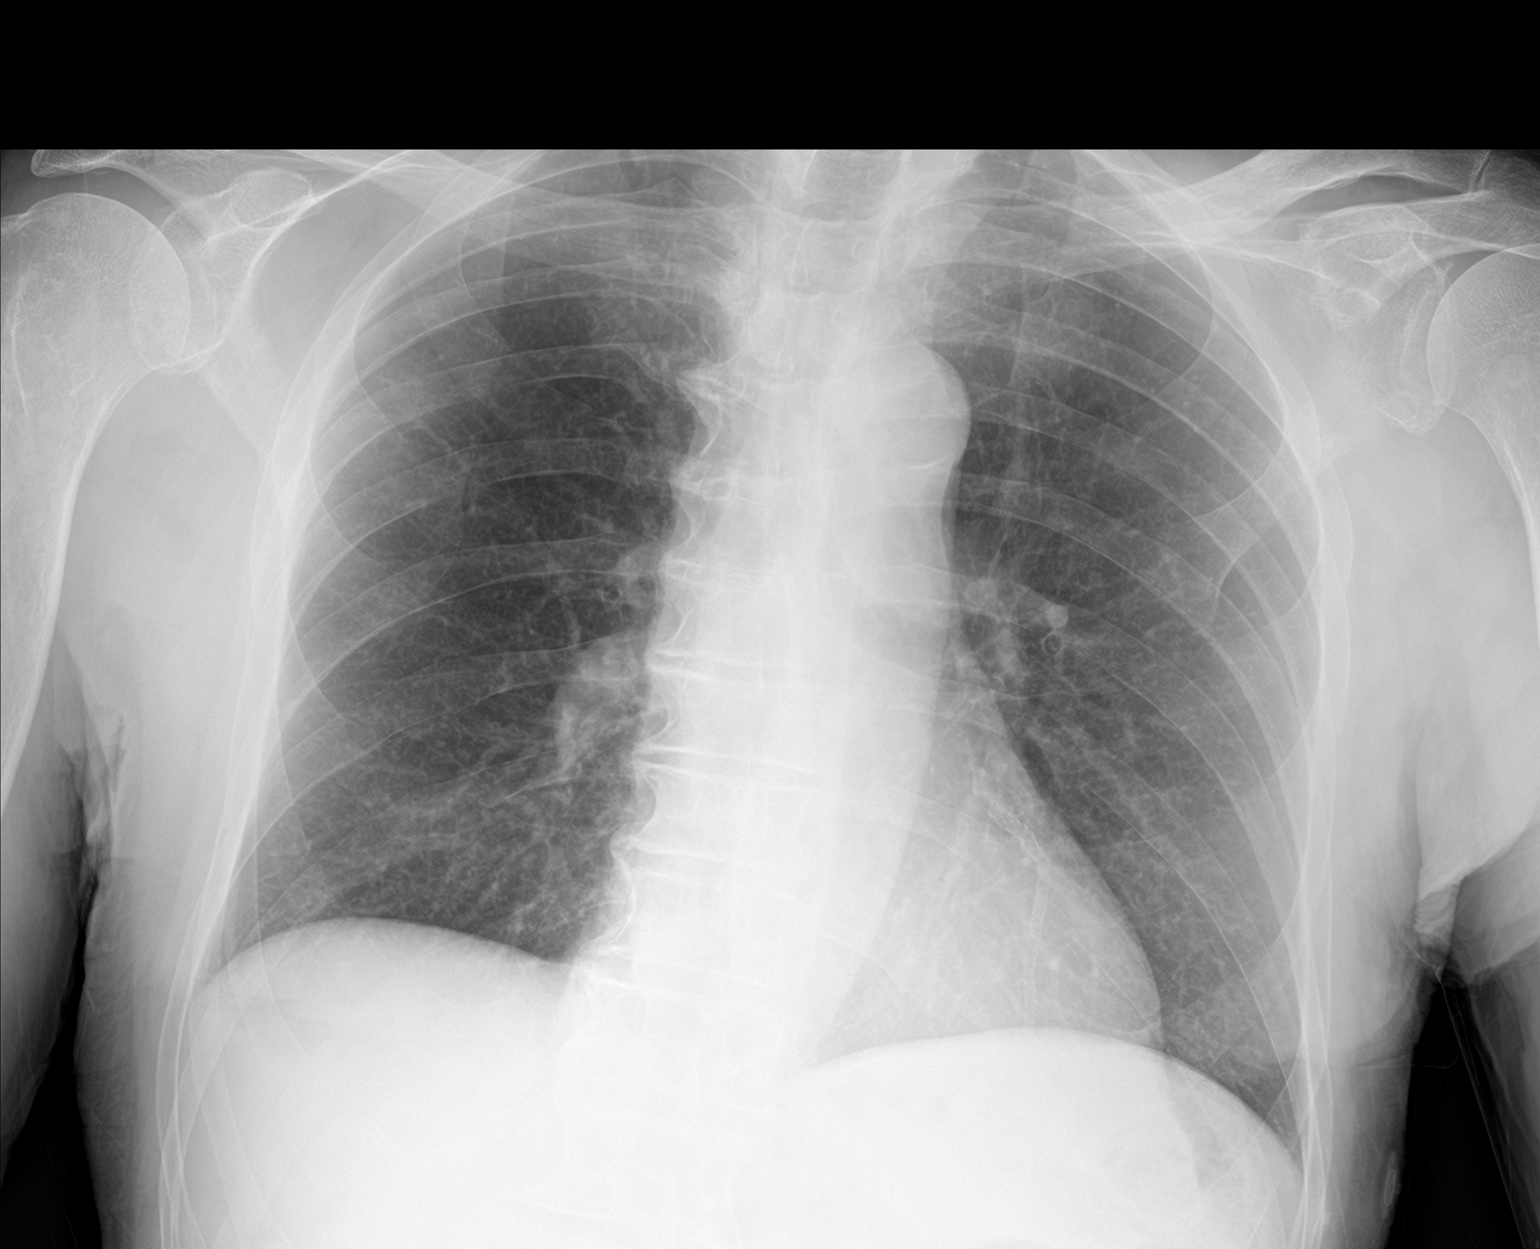

[2 of 2 positions shown; findings below may reference images not displayed]

FINDINGS: Cardiac and mediastinal contours are within normal limits. No focal
pulmonary opacity. No pleural effusion or pneumothorax. No acute
osseous abnormality.
IMPRESSION: No acute cardiopulmonary process.

## 2024-01-14 DIAGNOSIS — C61 Malignant neoplasm of prostate: Secondary | ICD-10-CM | POA: Diagnosis not present

## 2024-01-14 DIAGNOSIS — C8580 Other specified types of non-Hodgkin lymphoma, unspecified site: Secondary | ICD-10-CM | POA: Diagnosis not present

## 2024-01-14 DIAGNOSIS — D472 Monoclonal gammopathy: Secondary | ICD-10-CM | POA: Diagnosis not present

## 2024-05-12 DIAGNOSIS — C8580 Other specified types of non-Hodgkin lymphoma, unspecified site: Secondary | ICD-10-CM | POA: Diagnosis not present

## 2024-08-18 DIAGNOSIS — G20A1 Parkinson's disease without dyskinesia, without mention of fluctuations: Secondary | ICD-10-CM | POA: Diagnosis not present

## 2024-08-18 DIAGNOSIS — S0990XA Unspecified injury of head, initial encounter: Secondary | ICD-10-CM | POA: Diagnosis not present

## 2024-08-18 DIAGNOSIS — C8590 Non-Hodgkin lymphoma, unspecified, unspecified site: Secondary | ICD-10-CM | POA: Diagnosis not present

## 2024-08-18 DIAGNOSIS — I2510 Atherosclerotic heart disease of native coronary artery without angina pectoris: Secondary | ICD-10-CM | POA: Diagnosis not present

## 2024-08-18 DIAGNOSIS — S2242XA Multiple fractures of ribs, left side, initial encounter for closed fracture: Secondary | ICD-10-CM | POA: Diagnosis not present

## 2024-08-18 DIAGNOSIS — W182XXA Fall in (into) shower or empty bathtub, initial encounter: Secondary | ICD-10-CM | POA: Diagnosis not present

## 2024-08-18 DIAGNOSIS — S1980XA Other specified injuries of unspecified part of neck, initial encounter: Secondary | ICD-10-CM | POA: Diagnosis not present

## 2024-08-18 DIAGNOSIS — Z87891 Personal history of nicotine dependence: Secondary | ICD-10-CM | POA: Diagnosis not present

## 2024-08-18 DIAGNOSIS — Z7982 Long term (current) use of aspirin: Secondary | ICD-10-CM | POA: Diagnosis not present
